# Patient Record
Sex: Male | Born: 1955 | Race: White | Hispanic: No | Marital: Married | Smoking: Former smoker
Health system: Southern US, Community
[De-identification: ages and names within clinical notes are randomized; demographics above are authoritative.]

## PROBLEM LIST (undated history)

## (undated) DIAGNOSIS — Z794 Long term (current) use of insulin: Secondary | ICD-10-CM

## (undated) DIAGNOSIS — K432 Incisional hernia without obstruction or gangrene: Secondary | ICD-10-CM

## (undated) DIAGNOSIS — H269 Unspecified cataract: Secondary | ICD-10-CM

## (undated) DIAGNOSIS — M1990 Unspecified osteoarthritis, unspecified site: Secondary | ICD-10-CM

## (undated) DIAGNOSIS — I4891 Unspecified atrial fibrillation: Secondary | ICD-10-CM

## (undated) DIAGNOSIS — E785 Hyperlipidemia, unspecified: Secondary | ICD-10-CM

## (undated) DIAGNOSIS — R351 Nocturia: Secondary | ICD-10-CM

## (undated) DIAGNOSIS — N529 Male erectile dysfunction, unspecified: Secondary | ICD-10-CM

## (undated) DIAGNOSIS — Z86004 Personal history of in-situ neoplasm of other and unspecified digestive organs: Secondary | ICD-10-CM

## (undated) DIAGNOSIS — I2510 Atherosclerotic heart disease of native coronary artery without angina pectoris: Secondary | ICD-10-CM

## (undated) DIAGNOSIS — S83207A Unspecified tear of unspecified meniscus, current injury, left knee, initial encounter: Secondary | ICD-10-CM

## (undated) DIAGNOSIS — Z9189 Other specified personal risk factors, not elsewhere classified: Secondary | ICD-10-CM

## (undated) DIAGNOSIS — I10 Essential (primary) hypertension: Secondary | ICD-10-CM

## (undated) DIAGNOSIS — N400 Enlarged prostate without lower urinary tract symptoms: Secondary | ICD-10-CM

## (undated) DIAGNOSIS — Z86010 Personal history of colonic polyps: Secondary | ICD-10-CM

## (undated) DIAGNOSIS — E113393 Type 2 diabetes mellitus with moderate nonproliferative diabetic retinopathy without macular edema, bilateral: Secondary | ICD-10-CM

## (undated) DIAGNOSIS — E119 Type 2 diabetes mellitus without complications: Secondary | ICD-10-CM

## (undated) DIAGNOSIS — E7800 Pure hypercholesterolemia, unspecified: Secondary | ICD-10-CM

## (undated) DIAGNOSIS — Z973 Presence of spectacles and contact lenses: Secondary | ICD-10-CM

## (undated) DIAGNOSIS — I700 Atherosclerosis of aorta: Secondary | ICD-10-CM

## (undated) DIAGNOSIS — N401 Enlarged prostate with lower urinary tract symptoms: Secondary | ICD-10-CM

## (undated) DIAGNOSIS — N138 Other obstructive and reflux uropathy: Secondary | ICD-10-CM

## (undated) DIAGNOSIS — E162 Hypoglycemia, unspecified: Secondary | ICD-10-CM

## (undated) HISTORY — DX: Benign prostatic hyperplasia without lower urinary tract symptoms: N40.0

## (undated) HISTORY — DX: Atherosclerotic heart disease of native coronary artery without angina pectoris: I25.10

## (undated) HISTORY — DX: Incisional hernia without obstruction or gangrene: K43.2

## (undated) HISTORY — PX: SHOULDER ARTHROSCOPY: SHX128

## (undated) HISTORY — PX: ACHILLES TENDON REPAIR: SUR1153

## (undated) HISTORY — DX: Nocturia: R35.1

## (undated) HISTORY — DX: Unspecified cataract: H26.9

## (undated) HISTORY — PX: RETINAL DETACHMENT SURGERY: SHX105

## (undated) HISTORY — DX: Hypoglycemia, unspecified: E16.2

## (undated) HISTORY — DX: Benign prostatic hyperplasia with lower urinary tract symptoms: N40.1

## (undated) HISTORY — DX: Type 2 diabetes mellitus without complications: E11.9

## (undated) HISTORY — DX: Pure hypercholesterolemia, unspecified: E78.00

## (undated) HISTORY — DX: Male erectile dysfunction, unspecified: N52.9

## (undated) HISTORY — DX: Type 2 diabetes mellitus with moderate nonproliferative diabetic retinopathy without macular edema, bilateral: E11.3393

## (undated) HISTORY — DX: Unspecified osteoarthritis, unspecified site: M19.90

## (undated) HISTORY — DX: Unspecified atrial fibrillation: I48.91

## (undated) HISTORY — DX: Long term (current) use of insulin: Z79.4

## (undated) HISTORY — PX: COLONOSCOPY: SHX174

## (undated) HISTORY — DX: Other obstructive and reflux uropathy: N13.8

## (undated) HISTORY — DX: Morbid (severe) obesity due to excess calories: E66.01

## (undated) HISTORY — DX: Atherosclerosis of aorta: I70.0

---

## 2004-08-01 ENCOUNTER — Encounter: Admission: RE | Admit: 2004-08-01 | Discharge: 2004-08-01 | Payer: Self-pay | Admitting: *Deleted

## 2005-07-13 ENCOUNTER — Ambulatory Visit (HOSPITAL_COMMUNITY): Admission: RE | Admit: 2005-07-13 | Discharge: 2005-07-13 | Payer: Self-pay | Admitting: General Surgery

## 2005-07-13 ENCOUNTER — Encounter (INDEPENDENT_AMBULATORY_CARE_PROVIDER_SITE_OTHER): Payer: Self-pay | Admitting: Specialist

## 2005-07-13 HISTORY — PX: OTHER SURGICAL HISTORY: SHX169

## 2009-06-19 ENCOUNTER — Emergency Department (HOSPITAL_COMMUNITY): Admission: EM | Admit: 2009-06-19 | Discharge: 2009-06-20 | Payer: Self-pay | Admitting: Emergency Medicine

## 2010-05-31 ENCOUNTER — Encounter: Payer: Self-pay | Admitting: Specialist

## 2010-07-28 LAB — GLUCOSE, CAPILLARY: Glucose-Capillary: 127 mg/dL — ABNORMAL HIGH (ref 70–99)

## 2010-09-24 NOTE — Op Note (Signed)
[name], [name] NO.:  [id number]   MEDICAL RECORD NO.:  [id number]          PATIENT TYPE:  AMB   LOCATION:  DAY                          FACILITY:  [facility]   PHYSICIAN:  Ollen Gross. Vernell Morgans, M.D. DATE OF BIRTH:  [birth year]   DATE OF PROCEDURE:  07/13/2005  DATE OF DISCHARGE:                                 OPERATIVE REPORT   PREOPERATIVE DIAGNOSES:  Rectal adenocarcinoma in situ.   POSTOPERATIVE DIAGNOSES:  Rectal adenocarcinoma in situ.   PROCEDURE:  Transanal rectal biopsy.   SURGEON:  Ollen Gross. Carolynne Edouard, M.D.   ASSISTANT:  Gita Kudo, M.D.   ANESTHESIA:  General endotracheal.   DESCRIPTION OF PROCEDURE:  After informed consent was obtained, the patient  was brought to the operating room and left in the supine position on the  stretcher. After adequate induction of general anesthesia, the patient was  moved into a prone position on the operating room table and all pressure  points were padded. The patient's buttocks were retracted laterally with  tape, the perirectal region was prepped with Betadine, draped in the usual  sterile manner. Initially a small bullet retractor was placed in the rectum  and no abnormalities were noted. Next a deep Fansler retractor was placed in  the rectum. Posteriorly a small area that had a blue tinge to it was  identified. The patient had had a previous colonoscopy where a polyp was  removed that had adenocarcinoma in situ in it and this area was subsequently  tattooed with [place] ink. I believe that this posterior area of blue  appearance is the same area that was seen on colonoscopy. No other areas of  blue dye or abnormality were noted. This area was able to be grasped in the  ring of a ring forceps and while gentle traction was applied to this area a  laparoscopic GIA 45 stapler with a blue load was able to be placed beneath  this specimen, clamped and fired thereby excising this area beneath the  staple line. The  specimen was then removed from the patient and oriented. A  single silk stitch was placed at the cranial end of the staple line. The  staple line was examined and found to be hemostatic. No other areas of blue  enhancement were able to be identified. The perirectal region was then  infiltrated with 0.25% Marcaine with epinephrine and lidocaine jelly and two  small pieces of Gelfoam were placed in the rectum. Sterile dressings were  then applied. The patient tolerated the procedure well. At the end of the  case, all needle, sponge and instrument counts were correct. The patient was  then awakened and taken to the recovery room in stable condition.      Ollen Gross. Vernell Morgans, M.D.  Electronically Signed     PST/MEDQ  D:  07/13/2005  T:  07/14/2005  Job:  [id number]

## 2015-08-26 DIAGNOSIS — I10 Essential (primary) hypertension: Secondary | ICD-10-CM | POA: Diagnosis not present

## 2015-08-26 DIAGNOSIS — E119 Type 2 diabetes mellitus without complications: Secondary | ICD-10-CM | POA: Diagnosis not present

## 2015-08-26 DIAGNOSIS — E6609 Other obesity due to excess calories: Secondary | ICD-10-CM | POA: Diagnosis not present

## 2015-08-26 DIAGNOSIS — N5201 Erectile dysfunction due to arterial insufficiency: Secondary | ICD-10-CM | POA: Diagnosis not present

## 2015-08-26 DIAGNOSIS — Z125 Encounter for screening for malignant neoplasm of prostate: Secondary | ICD-10-CM | POA: Diagnosis not present

## 2015-08-26 DIAGNOSIS — Z209 Contact with and (suspected) exposure to unspecified communicable disease: Secondary | ICD-10-CM | POA: Diagnosis not present

## 2015-11-17 DIAGNOSIS — L509 Urticaria, unspecified: Secondary | ICD-10-CM | POA: Diagnosis not present

## 2016-03-18 DIAGNOSIS — M25552 Pain in left hip: Secondary | ICD-10-CM | POA: Diagnosis not present

## 2016-03-18 DIAGNOSIS — M25562 Pain in left knee: Secondary | ICD-10-CM | POA: Diagnosis not present

## 2016-03-29 DIAGNOSIS — M25562 Pain in left knee: Secondary | ICD-10-CM | POA: Diagnosis not present

## 2016-04-29 DIAGNOSIS — J069 Acute upper respiratory infection, unspecified: Secondary | ICD-10-CM | POA: Diagnosis not present

## 2016-07-25 DIAGNOSIS — E7800 Pure hypercholesterolemia, unspecified: Secondary | ICD-10-CM | POA: Diagnosis not present

## 2016-07-25 DIAGNOSIS — I10 Essential (primary) hypertension: Secondary | ICD-10-CM | POA: Diagnosis not present

## 2016-07-25 DIAGNOSIS — Z86010 Personal history of colonic polyps: Secondary | ICD-10-CM | POA: Diagnosis not present

## 2016-07-25 DIAGNOSIS — E119 Type 2 diabetes mellitus without complications: Secondary | ICD-10-CM | POA: Diagnosis not present

## 2016-08-15 DIAGNOSIS — M238X2 Other internal derangements of left knee: Secondary | ICD-10-CM | POA: Diagnosis not present

## 2016-08-18 ENCOUNTER — Other Ambulatory Visit: Payer: Self-pay | Admitting: Specialist

## 2016-08-24 ENCOUNTER — Ambulatory Visit: Payer: Self-pay | Admitting: Orthopedic Surgery

## 2016-08-26 ENCOUNTER — Encounter (HOSPITAL_BASED_OUTPATIENT_CLINIC_OR_DEPARTMENT_OTHER): Payer: Self-pay | Admitting: *Deleted

## 2016-08-26 NOTE — Progress Notes (Signed)
   08/26/16 1341  OBSTRUCTIVE SLEEP APNEA  Have you ever been diagnosed with sleep apnea through a sleep study? No  Do you snore loudly (loud enough to be heard through closed doors)?  1  Do you often feel tired, fatigued, or sleepy during the daytime (such as falling asleep during driving or talking to someone)? 0  Has anyone observed you stop breathing during your sleep? 0  Do you have, or are you being treated for high blood pressure? 1  BMI more than 35 kg/m2? 1  Age > 50 (1-yes) 1  Male Gender (Yes=1) 1  Obstructive Sleep Apnea Score 5   

## 2016-08-26 NOTE — Progress Notes (Signed)
NPO AFTER MN W/ EXCEPTION CLEAR LIQUIDS UNTIL 0700 ( NO CREAM /MILK PRODUCTS).  ARRIVE AT 1100.  NEEDS ISTAT 8 AND EKG.  WILL TAKE METOPROLOL AND PRAVASTATIN AM DOS W/ SIPS OF WATER.  VERBALIZED UNDERSTANDING TO DO HALF LEVEMIR DOSE NIGHT BEFORE SURGERY .   PT STATED THAT HE HAS A CROWN MISSING FROM RIGHT UPPER TOOTH.

## 2016-09-01 ENCOUNTER — Ambulatory Visit (HOSPITAL_COMMUNITY): Payer: BLUE CROSS/BLUE SHIELD

## 2016-09-01 ENCOUNTER — Encounter (HOSPITAL_BASED_OUTPATIENT_CLINIC_OR_DEPARTMENT_OTHER)
Admission: RE | Disposition: A | Discharge status: Home / Self Care | Payer: Self-pay | Source: Ambulatory Visit | Attending: Specialist

## 2016-09-01 ENCOUNTER — Other Ambulatory Visit: Payer: Self-pay

## 2016-09-01 ENCOUNTER — Encounter (HOSPITAL_BASED_OUTPATIENT_CLINIC_OR_DEPARTMENT_OTHER): Payer: Self-pay | Admitting: Anesthesiology

## 2016-09-01 ENCOUNTER — Ambulatory Visit (HOSPITAL_BASED_OUTPATIENT_CLINIC_OR_DEPARTMENT_OTHER): Payer: BLUE CROSS/BLUE SHIELD | Admitting: Anesthesiology

## 2016-09-01 ENCOUNTER — Ambulatory Visit (HOSPITAL_BASED_OUTPATIENT_CLINIC_OR_DEPARTMENT_OTHER)
Admission: RE | Admit: 2016-09-01 | Discharge: 2016-09-01 | Disposition: A | Discharge status: Home / Self Care | Payer: BLUE CROSS/BLUE SHIELD | Source: Ambulatory Visit | Attending: Specialist | Admitting: Specialist

## 2016-09-01 DIAGNOSIS — M1712 Unilateral primary osteoarthritis, left knee: Secondary | ICD-10-CM | POA: Diagnosis not present

## 2016-09-01 DIAGNOSIS — I10 Essential (primary) hypertension: Secondary | ICD-10-CM | POA: Diagnosis not present

## 2016-09-01 DIAGNOSIS — Z791 Long term (current) use of non-steroidal anti-inflammatories (NSAID): Secondary | ICD-10-CM | POA: Diagnosis not present

## 2016-09-01 DIAGNOSIS — E785 Hyperlipidemia, unspecified: Secondary | ICD-10-CM | POA: Diagnosis not present

## 2016-09-01 DIAGNOSIS — M94262 Chondromalacia, left knee: Secondary | ICD-10-CM | POA: Insufficient documentation

## 2016-09-01 DIAGNOSIS — Z87891 Personal history of nicotine dependence: Secondary | ICD-10-CM | POA: Insufficient documentation

## 2016-09-01 DIAGNOSIS — S83232A Complex tear of medial meniscus, current injury, left knee, initial encounter: Secondary | ICD-10-CM | POA: Insufficient documentation

## 2016-09-01 DIAGNOSIS — S83242A Other tear of medial meniscus, current injury, left knee, initial encounter: Secondary | ICD-10-CM | POA: Diagnosis not present

## 2016-09-01 DIAGNOSIS — M84452A Pathological fracture, left femur, initial encounter for fracture: Secondary | ICD-10-CM | POA: Diagnosis not present

## 2016-09-01 DIAGNOSIS — Z7982 Long term (current) use of aspirin: Secondary | ICD-10-CM | POA: Insufficient documentation

## 2016-09-01 DIAGNOSIS — Z86008 Personal history of in-situ neoplasm of other site: Secondary | ICD-10-CM | POA: Diagnosis not present

## 2016-09-01 DIAGNOSIS — Z79899 Other long term (current) drug therapy: Secondary | ICD-10-CM | POA: Insufficient documentation

## 2016-09-01 DIAGNOSIS — S82142A Displaced bicondylar fracture of left tibia, initial encounter for closed fracture: Secondary | ICD-10-CM | POA: Diagnosis not present

## 2016-09-01 DIAGNOSIS — E119 Type 2 diabetes mellitus without complications: Secondary | ICD-10-CM | POA: Insufficient documentation

## 2016-09-01 DIAGNOSIS — Z9012 Acquired absence of left breast and nipple: Secondary | ICD-10-CM

## 2016-09-01 DIAGNOSIS — M84462A Pathological fracture, left tibia, initial encounter for fracture: Secondary | ICD-10-CM | POA: Diagnosis not present

## 2016-09-01 DIAGNOSIS — X58XXXA Exposure to other specified factors, initial encounter: Secondary | ICD-10-CM | POA: Insufficient documentation

## 2016-09-01 DIAGNOSIS — M23304 Other meniscus derangements, unspecified medial meniscus, left knee: Secondary | ICD-10-CM | POA: Diagnosis not present

## 2016-09-01 DIAGNOSIS — Z471 Aftercare following joint replacement surgery: Secondary | ICD-10-CM | POA: Diagnosis not present

## 2016-09-01 DIAGNOSIS — G8918 Other acute postprocedural pain: Secondary | ICD-10-CM | POA: Diagnosis not present

## 2016-09-01 DIAGNOSIS — Z419 Encounter for procedure for purposes other than remedying health state, unspecified: Secondary | ICD-10-CM

## 2016-09-01 DIAGNOSIS — Z96652 Presence of left artificial knee joint: Secondary | ICD-10-CM | POA: Diagnosis not present

## 2016-09-01 DIAGNOSIS — Z794 Long term (current) use of insulin: Secondary | ICD-10-CM | POA: Insufficient documentation

## 2016-09-01 HISTORY — DX: Other specified personal risk factors, not elsewhere classified: Z91.89

## 2016-09-01 HISTORY — DX: Type 2 diabetes mellitus without complications: E11.9

## 2016-09-01 HISTORY — DX: Personal history of colonic polyps: Z86.010

## 2016-09-01 HISTORY — DX: Unspecified osteoarthritis, unspecified site: M19.90

## 2016-09-01 HISTORY — DX: Hyperlipidemia, unspecified: E78.5

## 2016-09-01 HISTORY — PX: KNEE ARTHROSCOPY WITH MEDIAL MENISECTOMY: SHX5651

## 2016-09-01 HISTORY — DX: Unspecified tear of unspecified meniscus, current injury, left knee, initial encounter: S83.207A

## 2016-09-01 HISTORY — DX: Essential (primary) hypertension: I10

## 2016-09-01 HISTORY — DX: Personal history of in-situ neoplasm of other and unspecified digestive organs: Z86.004

## 2016-09-01 HISTORY — DX: Presence of spectacles and contact lenses: Z97.3

## 2016-09-01 LAB — POCT I-STAT, CHEM 8
BUN: 12 mg/dL (ref 6–20)
CHLORIDE: 104 mmol/L (ref 101–111)
Calcium, Ion: 1.21 mmol/L (ref 1.15–1.40)
Creatinine, Ser: 0.6 mg/dL — ABNORMAL LOW (ref 0.61–1.24)
Glucose, Bld: 121 mg/dL — ABNORMAL HIGH (ref 65–99)
HCT: 43 % (ref 39.0–52.0)
Hemoglobin: 14.6 g/dL (ref 13.0–17.0)
POTASSIUM: 3.6 mmol/L (ref 3.5–5.1)
SODIUM: 141 mmol/L (ref 135–145)
TCO2: 28 mmol/L (ref 0–100)

## 2016-09-01 LAB — GLUCOSE, CAPILLARY: Glucose-Capillary: 108 mg/dL — ABNORMAL HIGH (ref 65–99)

## 2016-09-01 SURGERY — ARTHROSCOPY, KNEE, WITH MEDIAL MENISCECTOMY
Anesthesia: General | Site: Knee | Laterality: Left

## 2016-09-01 MED ORDER — FENTANYL CITRATE (PF) 100 MCG/2ML IJ SOLN
INTRAMUSCULAR | Status: DC | PRN
  Administered 2016-09-01 (×4): 50 ug via INTRAVENOUS

## 2016-09-01 MED ORDER — PROPOFOL 10 MG/ML IV BOLUS
INTRAVENOUS | Status: AC
  Filled 2016-09-01: qty 40

## 2016-09-01 MED ORDER — FENTANYL CITRATE (PF) 100 MCG/2ML IJ SOLN
INTRAMUSCULAR | Status: AC
  Filled 2016-09-01: qty 2

## 2016-09-01 MED ORDER — MORPHINE SULFATE (PF) 4 MG/ML IV SOLN
INTRAVENOUS | Status: AC
  Filled 2016-09-01: qty 1

## 2016-09-01 MED ORDER — CEFAZOLIN SODIUM-DEXTROSE 2-4 GM/100ML-% IV SOLN
INTRAVENOUS | Status: AC
  Filled 2016-09-01: qty 100

## 2016-09-01 MED ORDER — BUPIVACAINE HCL (PF) 0.25 % IJ SOLN
INTRAMUSCULAR | Status: AC
  Filled 2016-09-01: qty 30

## 2016-09-01 MED ORDER — DEXAMETHASONE SODIUM PHOSPHATE 4 MG/ML IJ SOLN
INTRAMUSCULAR | Status: DC | PRN
  Administered 2016-09-01: 10 mg via INTRAVENOUS

## 2016-09-01 MED ORDER — CEPHALEXIN 500 MG PO CAPS: 500.0000 mg | ORAL_CAPSULE | Freq: Three times a day (TID) | ORAL | 0 refills | Status: AC

## 2016-09-01 MED ORDER — TRIAMCINOLONE ACETONIDE 40 MG/ML IJ SUSP
INTRAMUSCULAR | Status: AC
  Filled 2016-09-01: qty 2

## 2016-09-01 MED ORDER — ONDANSETRON HCL 4 MG/2ML IJ SOLN
INTRAMUSCULAR | Status: DC | PRN
  Administered 2016-09-01: 4 mg via INTRAVENOUS

## 2016-09-01 MED ORDER — DEXTROSE 5 % IV SOLN
3.0000 g | INTRAVENOUS | Status: AC
  Administered 2016-09-01: 3 g via INTRAVENOUS
  Filled 2016-09-01: qty 3

## 2016-09-01 MED ORDER — SODIUM CHLORIDE 0.9 % IR SOLN
Status: DC | PRN
  Administered 2016-09-01 (×2): 3000 mL

## 2016-09-01 MED ORDER — CHLORHEXIDINE GLUCONATE 4 % EX LIQD
60.0000 mL | Freq: Once | CUTANEOUS | Status: DC
  Filled 2016-09-01: qty 118

## 2016-09-01 MED ORDER — LACTATED RINGERS IV SOLN
INTRAVENOUS | Status: DC
  Administered 2016-09-01 (×2): via INTRAVENOUS
  Filled 2016-09-01: qty 1000

## 2016-09-01 MED ORDER — MIDAZOLAM HCL 2 MG/2ML IJ SOLN
INTRAMUSCULAR | Status: AC
  Filled 2016-09-01: qty 2

## 2016-09-01 MED ORDER — LABETALOL HCL 5 MG/ML IV SOLN
INTRAVENOUS | Status: DC | PRN
  Administered 2016-09-01 (×3): 5 mg via INTRAVENOUS

## 2016-09-01 MED ORDER — METOCLOPRAMIDE HCL 5 MG/ML IJ SOLN
INTRAMUSCULAR | Status: AC
  Filled 2016-09-01: qty 2

## 2016-09-01 MED ORDER — PROPOFOL 10 MG/ML IV BOLUS
INTRAVENOUS | Status: DC | PRN
  Administered 2016-09-01: 300 mg via INTRAVENOUS

## 2016-09-01 MED ORDER — HYDROMORPHONE HCL 4 MG/ML IJ SOLN
INTRAMUSCULAR | Status: AC
  Filled 2016-09-01: qty 1

## 2016-09-01 MED ORDER — HYDROCODONE-ACETAMINOPHEN 7.5-325 MG PO TABS: 1.0000 | ORAL_TABLET | ORAL | 0 refills | Status: AC | PRN

## 2016-09-01 MED ORDER — CEFAZOLIN SODIUM-DEXTROSE 2-4 GM/100ML-% IV SOLN
2.0000 g | INTRAVENOUS | Status: DC
  Filled 2016-09-01: qty 100

## 2016-09-01 MED ORDER — ACETAMINOPHEN 10 MG/ML IV SOLN
INTRAVENOUS | Status: AC
Start: 2016-09-01 — End: 2016-09-01
  Filled 2016-09-01: qty 100

## 2016-09-01 MED ORDER — LIDOCAINE 2% (20 MG/ML) 5 ML SYRINGE
INTRAMUSCULAR | Status: DC | PRN
  Administered 2016-09-01: 100 mg via INTRAVENOUS

## 2016-09-01 MED ORDER — MORPHINE SULFATE (PF) 4 MG/ML IV SOLN
INTRAVENOUS | Status: DC | PRN
  Administered 2016-09-01: 1 mg

## 2016-09-01 MED ORDER — ASPIRIN EC 325 MG PO TBEC: 325.0000 mg | DELAYED_RELEASE_TABLET | Freq: Two times a day (BID) | ORAL | 0 refills | Status: DC

## 2016-09-01 MED ORDER — METOCLOPRAMIDE HCL 5 MG/ML IJ SOLN
INTRAMUSCULAR | Status: DC | PRN
  Administered 2016-09-01: 10 mg via INTRAVENOUS

## 2016-09-01 MED ORDER — LIDOCAINE 2% (20 MG/ML) 5 ML SYRINGE
INTRAMUSCULAR | Status: AC
  Filled 2016-09-01: qty 5

## 2016-09-01 MED ORDER — ONDANSETRON HCL 4 MG/2ML IJ SOLN
INTRAMUSCULAR | Status: AC
  Filled 2016-09-01: qty 2

## 2016-09-01 MED ORDER — HYDROMORPHONE HCL 1 MG/ML IJ SOLN
INTRAMUSCULAR | Status: DC | PRN
  Administered 2016-09-01 (×3): 0.5 mg via INTRAVENOUS

## 2016-09-01 MED ORDER — DEXAMETHASONE SODIUM PHOSPHATE 10 MG/ML IJ SOLN
INTRAMUSCULAR | Status: AC
  Filled 2016-09-01: qty 1

## 2016-09-01 MED ORDER — FENTANYL CITRATE (PF) 100 MCG/2ML IJ SOLN
25.0000 ug | INTRAMUSCULAR | Status: DC | PRN
  Administered 2016-09-01 (×2): 25 ug via INTRAVENOUS
  Filled 2016-09-01: qty 1

## 2016-09-01 MED ORDER — LABETALOL HCL 5 MG/ML IV SOLN
INTRAVENOUS | Status: AC
  Filled 2016-09-01: qty 4

## 2016-09-01 MED ORDER — MIDAZOLAM HCL 5 MG/5ML IJ SOLN
INTRAMUSCULAR | Status: DC | PRN
  Administered 2016-09-01: 2 mg via INTRAVENOUS

## 2016-09-01 MED ORDER — ACETAMINOPHEN 10 MG/ML IV SOLN
INTRAVENOUS | Status: DC | PRN
  Administered 2016-09-01: 1000 mg via INTRAVENOUS

## 2016-09-01 MED ORDER — BUPIVACAINE HCL 0.25 % IJ SOLN
INTRAMUSCULAR | Status: DC | PRN
  Administered 2016-09-01: 20 mL
  Administered 2016-09-01: 10 mL

## 2016-09-01 MED FILL — CEPHALEXIN 500 MG CAPSULE: 500 | 4 days supply | Qty: 12 | Fill #0

## 2016-09-01 MED FILL — HYDROCODON-APAP 7.5-325: 7.5-325 | 5 days supply | Qty: 60 | Fill #0

## 2016-09-01 SURGICAL SUPPLY — 54 items
BANDAGE ESMARK 6X9 LF (GAUZE/BANDAGES/DRESSINGS) ×1 IMPLANT
BLADE CUDA GRT WHITE 3.5 (BLADE) ×3 IMPLANT
BNDG CMPR 9X6 STRL LF SNTH (GAUZE/BANDAGES/DRESSINGS) ×1
BNDG ESMARK 6X9 LF (GAUZE/BANDAGES/DRESSINGS) ×3
BNDG GAUZE ELAST 4 BULKY (GAUZE/BANDAGES/DRESSINGS) ×3 IMPLANT
DRAPE ARTHROSCOPY W/POUCH 114 (DRAPES) ×3 IMPLANT
DRAPE C-ARM 42X72 X-RAY (DRAPES) ×3 IMPLANT
DRAPE INCISE IOBAN 66X45 STRL (DRAPES) ×3 IMPLANT
DRSG PAD ABDOMINAL 8X10 ST (GAUZE/BANDAGES/DRESSINGS) ×2 IMPLANT
DURAPREP 26ML APPLICATOR (WOUND CARE) ×3 IMPLANT
ELECT MENISCUS 165MM 90D (ELECTRODE) IMPLANT
ELECT REM PT RETURN 9FT ADLT (ELECTROSURGICAL) ×3
ELECTRODE REM PT RTRN 9FT ADLT (ELECTROSURGICAL) IMPLANT
GAUZE SPONGE 4X4 12PLY STRL (GAUZE/BANDAGES/DRESSINGS) ×1 IMPLANT
GAUZE XEROFORM 1X8 LF (GAUZE/BANDAGES/DRESSINGS) ×3 IMPLANT
GLOVE BIO SURGEON STRL SZ7.5 (GLOVE) ×3 IMPLANT
GLOVE BIO SURGEON STRL SZ8 (GLOVE) ×3 IMPLANT
GLOVE ECLIPSE 7.0 STRL STRAW (GLOVE) ×2 IMPLANT
GLOVE INDICATOR 7.0 STRL GRN (GLOVE) ×4 IMPLANT
GLOVE INDICATOR 8.0 STRL GRN (GLOVE) ×6 IMPLANT
GOWN STRL REUS W/ TWL LRG LVL3 (GOWN DISPOSABLE) ×1 IMPLANT
GOWN STRL REUS W/ TWL XL LVL3 (GOWN DISPOSABLE) ×1 IMPLANT
GOWN STRL REUS W/TWL LRG LVL3 (GOWN DISPOSABLE)
GOWN STRL REUS W/TWL XL LVL3 (GOWN DISPOSABLE) ×9
GRAFT FILLER BONE 5ML (Knees) IMPLANT
IMMOBILIZER KNEE 22 UNIV (SOFTGOODS) IMPLANT
IMMOBILIZER KNEE 24 THIGH 36 (MISCELLANEOUS) IMPLANT
IMMOBILIZER KNEE 24 UNIV (MISCELLANEOUS)
IV NS IRRIG 3000ML ARTHROMATIC (IV SOLUTION) ×6 IMPLANT
KIT ACCUFILL 5CC (Knees) ×1 IMPLANT
KIT KNEE SCP 414.502 (Knees) ×3 IMPLANT
KIT MIXER ACCUMIX (KITS) ×2 IMPLANT
KIT RM TURNOVER CYSTO AR (KITS) ×3 IMPLANT
KNEE KIT SCP W/SIDE ACCUPORT (Joint) ×2 IMPLANT
KNEE WRAP E Z 3 GEL PACK (MISCELLANEOUS) ×3 IMPLANT
MANIFOLD NEPTUNE II (INSTRUMENTS) ×3 IMPLANT
NEEDLE HYPO 22GX1.5 SAFETY (NEEDLE) ×3 IMPLANT
PACK ARTHROSCOPY DSU (CUSTOM PROCEDURE TRAY) ×3 IMPLANT
PACK BASIN DAY SURGERY FS (CUSTOM PROCEDURE TRAY) ×3 IMPLANT
PAD ABD 8X10 STRL (GAUZE/BANDAGES/DRESSINGS) ×3 IMPLANT
PAD ARMBOARD 7.5X6 YLW CONV (MISCELLANEOUS) ×3 IMPLANT
PENCIL BUTTON HOLSTER BLD 10FT (ELECTRODE) IMPLANT
PROBE BIPOLAR 50 DEGREE SUCT (MISCELLANEOUS) ×1 IMPLANT
PROBE BIPOLAR ATHRO 135MM 90D (MISCELLANEOUS) IMPLANT
SET ARTHROSCOPY TUBING (MISCELLANEOUS) ×3
SET ARTHROSCOPY TUBING LN (MISCELLANEOUS) ×1 IMPLANT
SPONGE GAUZE 4X4 12PLY (GAUZE/BANDAGES/DRESSINGS) ×3 IMPLANT
SUT ETHILON 4 0 PS 2 18 (SUTURE) ×3 IMPLANT
SYR CONTROL 10ML LL (SYRINGE) ×3 IMPLANT
TOWEL OR 17X24 6PK STRL BLUE (TOWEL DISPOSABLE) ×5 IMPLANT
TUBE CONNECTING 12'X1/4 (SUCTIONS) ×1
TUBE CONNECTING 12X1/4 (SUCTIONS) ×2 IMPLANT
WAND 30 DEG SABER W/CORD (SURGICAL WAND) IMPLANT
WATER STERILE IRR 500ML POUR (IV SOLUTION) ×3 IMPLANT

## 2016-09-01 NOTE — Op Note (Signed)
[phone]

## 2016-09-01 NOTE — Anesthesia Preprocedure Evaluation (Addendum)
Anesthesia Evaluation  Patient identified by MRN, date of birth, ID band Patient awake    Reviewed: Allergy & Precautions, NPO status , Unable to perform ROS - Chart review only  Airway Mallampati: II  TM Distance: >3 FB     Dental   Pulmonary former smoker,    breath sounds clear to auscultation       Cardiovascular hypertension,  Rhythm:Regular Rate:Normal     Neuro/Psych    GI/Hepatic negative GI ROS, Neg liver ROS,   Endo/Other  diabetes  Renal/GU      Musculoskeletal  (+) Arthritis ,   Abdominal   Peds  Hematology   Anesthesia Other Findings   Reproductive/Obstetrics                             Anesthesia Physical Anesthesia Plan  ASA: III  Anesthesia Plan: General   Post-op Pain Management:    Induction: Intravenous  Airway Management Planned: LMA  Additional Equipment:   Intra-op Plan:   Post-operative Plan: Extubation in OR  Informed Consent: I have reviewed the patients History and Physical, chart, labs and discussed the procedure including the risks, benefits and alternatives for the proposed anesthesia with the patient or authorized representative who has indicated his/her understanding and acceptance.   Dental advisory given  Plan Discussed with: Anesthesiologist and CRNA  Anesthesia Plan Comments:         Anesthesia Quick Evaluation

## 2016-09-01 NOTE — Discharge Instructions (Signed)

## 2016-09-01 NOTE — Anesthesia Procedure Notes (Signed)
Anesthesia Regional Block: Adductor canal block   Pre-Anesthetic Checklist: ,, timeout performed, Correct Patient, Correct Site, Correct Laterality, Correct Procedure, Correct Position, site marked, Risks and benefits discussed, Surgical consent,  Pre-op evaluation,  At surgeon's request  Laterality: Left  Prep: chloraprep       Needles:   Needle Type: Stimulator Needle - 80          Additional Needles:   Procedures: Doppler guided, Ultrasound guided,,,,,,  Narrative:  Start time: 09/01/2016 12:25 PM End time: 09/01/2016 12:40 PM Injection made incrementally with aspirations every 5 mL.  Performed by: Personally  Anesthesiologist: Dorris Singh

## 2016-09-01 NOTE — Transfer of Care (Signed)
  Last Vitals:  Vitals:   09/01/16 1103 09/01/16 1609  BP: (!) 145/78   Pulse: 81 (P) 82  Resp: 20 (P) 12  Temp: 36.3 C (P) 36.8 C    Last Pain:  Vitals:   09/01/16 1103  TempSrc: Oral      Patients Stated Pain Goal: 5 (09/01/16 1128) Immediate Anesthesia Transfer of Care Note  Patient: Andrew [name]  Procedure(s) Performed: Procedure(s) (LRB): Left knee arthroscopy, partial medial meniscectomy, chondroplasty, arthroscopic assisted internal fixation of medial femoral condyle and medial tibial plateau (Left)  Patient Location: PACU  Anesthesia Type: General  Level of Consciousness: awake, alert  and oriented  Airway & Oxygen Therapy: Patient Spontanous Breathing and Patient connected to nasal cannula oxygen  Post-op Assessment: Report given to PACU RN and Post -op Vital signs reviewed and stable  Post vital signs: Reviewed and stable  Complications: No apparent anesthesia complications

## 2016-09-01 NOTE — Interval H&P Note (Signed)
History and Physical Interval Note:  09/01/2016 2:23 PM  Andrew [name]  has presented today for surgery, with the diagnosis of Left knee torn medial meniscus, medial femoral condyle and medial tibial plateau insufficiency fractures  The various methods of treatment have been discussed with the patient and family. After consideration of risks, benefits and other options for treatment, the patient has consented to  Procedure(s): Left knee arthroscopy, partial medial meniscectomy, chondroplasty, arthroscopic assisted internal fixation of medial femoral condyle and medial tibial plateau (Left) as a surgical intervention .  The patient's history has been reviewed, patient examined, no change in status, stable for surgery.  I have reviewed the patient's chart and labs.  Questions were answered to the patient's satisfaction.     [name]

## 2016-09-01 NOTE — Anesthesia Preprocedure Evaluation (Addendum)
Anesthesia Evaluation  Patient identified by MRN, date of birth, ID band Patient awake    Reviewed: Allergy & Precautions, NPO status , Patient's Chart, lab work & pertinent test results  Airway Mallampati: II  TM Distance: >3 FB     Dental   Pulmonary former smoker,    breath sounds clear to auscultation       Cardiovascular hypertension,  Rhythm:Regular Rate:Normal     Neuro/Psych    GI/Hepatic negative GI ROS, Neg liver ROS,   Endo/Other  diabetes  Renal/GU      Musculoskeletal  (+) Arthritis ,   Abdominal   Peds  Hematology   Anesthesia Other Findings   Reproductive/Obstetrics                             Anesthesia Physical Anesthesia Plan  ASA: III  Anesthesia Plan: General   Post-op Pain Management:  Regional for Post-op pain   Induction: Intravenous  Airway Management Planned: LMA  Additional Equipment:   Intra-op Plan:   Post-operative Plan: Extubation in OR  Informed Consent: I have reviewed the patients History and Physical, chart, labs and discussed the procedure including the risks, benefits and alternatives for the proposed anesthesia with the patient or authorized representative who has indicated his/her understanding and acceptance.   Dental advisory given  Plan Discussed with: CRNA and Anesthesiologist  Anesthesia Plan Comments:        Anesthesia Quick Evaluation

## 2016-09-01 NOTE — H&P (Signed)
Andrew [name] is an 61 y.o. male.   Chief Complaint: Left knee pain HPI: Patient presents with joint discomfort that had been persistent for several weeks now. Despite conservative treatments, his discomfort has not improved. Imaging was obtained. Other conservative and surgical treatments were discussed in detail. Patient wishes to proceed with surgery as consented. Denies SOB, CP, or calf pain. No Fever, chills, or nausea/ vomiting.    Past Medical History:  Diagnosis Date  . Acute meniscal tear of left knee   . At risk for sleep apnea    STOP-BANG=  5      SENT TO PCP 08-26-2016  . History of carcinoma in situ of rectum    07-13-2005  . History of colon polyps    03-16-2009  hyperplastic   . Hyperlipidemia   . Hypertension   . OA (osteoarthritis)    left knee, fingers  . Type 2 diabetes mellitus (HCC)   . Wears glasses     Past Surgical History:  Procedure Laterality Date  . ACHILLES TENDON REPAIR Left 2013  approx.  . COLONOSCOPY  last one 2015  . RETINAL DETACHMENT SURGERY Right 2012  approx.  Marland Kitchen SHOULDER ARTHROSCOPY Left 2010 approx.  . TRANSANAL RECTAL EXCISIONAL BX  07/13/2005   adenocarcinoma in situ    History reviewed. No pertinent family history. Social History:  reports that he quit smoking about 25 years ago. His smoking use included Cigarettes. He quit after 21.00 years of use. He has never used smokeless tobacco. He reports that he does not drink alcohol or use drugs.  Allergies: No Known Allergies  Medications Prior to Admission  Medication Sig Dispense Refill  . acetaminophen (TYLENOL) 500 MG tablet Take 500 mg by mouth every 6 (six) hours as needed.    Marland Kitchen aspirin EC 81 MG tablet Take 81 mg by mouth daily.    Marland Kitchen ibuprofen (ADVIL,MOTRIN) 200 MG tablet Take 200 mg by mouth 2 (two) times daily. And PRN    . insulin detemir (LEVEMIR) 100 UNIT/ML injection Inject 32 Units into the skin at bedtime.    Marland Kitchen lisinopril (PRINIVIL,ZESTRIL) 10 MG tablet Take 10 mg by  mouth every morning.    . meloxicam (MOBIC) 15 MG tablet Take 15 mg by mouth daily.    . metFORMIN (GLUCOPHAGE) 1000 MG tablet Take 1,000 mg by mouth 2 (two) times daily with a meal. Takes one tablet in am /  1 1/2 tablets in pm    . metoprolol succinate (TOPROL-XL) 100 MG 24 hr tablet Take 100 mg by mouth every morning. Take with or immediately following a meal.    . Naproxen Sodium (ALEVE) 220 MG CAPS Take by mouth as needed.    . pioglitazone (ACTOS) 45 MG tablet Take 45 mg by mouth every morning.    . pravastatin (PRAVACHOL) 80 MG tablet Take 80 mg by mouth every morning.      Results for orders placed or performed during the hospital encounter of 09/01/16 (from the past 48 hour(s))  I-STAT, chem 8     Status: Abnormal   Collection Time: 09/01/16 11:50 AM  Result Value Ref Range   Sodium 141 135 - 145 mmol/L   Potassium 3.6 3.5 - 5.1 mmol/L   Chloride 104 101 - 111 mmol/L   BUN 12 6 - 20 mg/dL   Creatinine, Ser 1.61 (L) 0.61 - 1.24 mg/dL   Glucose, Bld 096 (H) 65 - 99 mg/dL   Calcium, Ion 0.45 4.09 - 1.40 mmol/L  TCO2 28 0 - 100 mmol/L   Hemoglobin 14.6 13.0 - 17.0 g/dL   HCT 40.9 81.1 - 91.4 %   No results found.  Review of Systems  Constitutional: Negative.   HENT: Negative.   Eyes: Negative.   Respiratory: Negative.   Cardiovascular: Negative.   Gastrointestinal: Negative.   Genitourinary: Negative.   Musculoskeletal: Positive for joint pain.  Skin: Negative.   Neurological: Negative.   Endo/Heme/Allergies: Negative.   Psychiatric/Behavioral: Negative.     Blood pressure (!) 145/78, pulse 81, temperature 97.4 F (36.3 C), temperature source Oral, resp. rate 20, height  (1.778 m), weight 128.8 kg (284 lb), SpO2 98 %. Physical Exam  Constitutional: He is oriented to person, place, and time. He appears well-developed.  HENT:  Head: Normocephalic.  Eyes: EOM are normal.  Neck: Normal range of motion.  Cardiovascular: Normal rate and intact distal pulses.    Respiratory: Effort normal.  GI: Soft.  Genitourinary:  Genitourinary Comments: Deferred  Musculoskeletal:  Left knee pain. Good ROM. LLE grossly n/v intact.  Neurological: He is alert and oriented to person, place, and time.  Skin: Skin is warm and dry.  Psychiatric: His behavior is normal.     Assessment/Plan Left knee meniscus tear, OA , and insufficiency fracture: Knee arthroscopy as consented D/c home Follow instructions f/u in office  STILWELL, BRYSON L, PA-C 09/01/2016, 2:07 PM

## 2016-09-01 NOTE — H&P (View-Only) (Signed)
   08/26/16 1341  OBSTRUCTIVE SLEEP APNEA  Have you ever been diagnosed with sleep apnea through a sleep study? No  Do you snore loudly (loud enough to be heard through closed doors)?  1  Do you often feel tired, fatigued, or sleepy during the daytime (such as falling asleep during driving or talking to someone)? 0  Has anyone observed you stop breathing during your sleep? 0  Do you have, or are you being treated for high blood pressure? 1  BMI more than 35 kg/m2? 1  Age > 50 (1-yes) 1  Male Gender (Yes=1) 1  Obstructive Sleep Apnea Score 5

## 2016-09-01 NOTE — Anesthesia Procedure Notes (Signed)
Procedure Name: LMA Insertion Date/Time: 09/01/2016 2:36 PM Performed by: Mal Amabile Pre-anesthesia Checklist: Patient identified, Emergency Drugs available, Suction available and Patient being monitored Patient Re-evaluated:Patient Re-evaluated prior to inductionOxygen Delivery Method: Circle system utilized Preoxygenation: Pre-oxygenation with 100% oxygen Intubation Type: IV induction Ventilation: Mask ventilation without difficulty LMA: LMA inserted LMA Size: 5.0 Number of attempts: 1 Airway Equipment and Method: Bite block Placement Confirmation: positive ETCO2 Tube secured with: Tape Dental Injury: Teeth and Oropharynx as per pre-operative assessment

## 2016-09-01 NOTE — Anesthesia Postprocedure Evaluation (Signed)
Anesthesia Post Note  Patient: Andrew [name]  Procedure(s) Performed: Procedure(s) (LRB): Left knee arthroscopy, partial medial meniscectomy, chondroplasty, arthroscopic assisted internal fixation of medial femoral condyle and medial tibial plateau (Left)  Patient location during evaluation: PACU Anesthesia Type: General Level of consciousness: awake and alert and oriented Pain management: pain level controlled Vital Signs Assessment: post-procedure vital signs reviewed and stable Respiratory status: spontaneous breathing, nonlabored ventilation and respiratory function stable Cardiovascular status: blood pressure returned to baseline and stable Postop Assessment: no signs of nausea or vomiting Anesthetic complications: no       Last Vitals:  Vitals:   09/01/16 1645 09/01/16 1700  BP: 131/83 128/81  Pulse: 78 76  Resp: 12 12  Temp:      Last Pain:  Vitals:   09/01/16 1645  TempSrc:   PainSc: 4                  Andrew Losee A.

## 2016-09-02 ENCOUNTER — Encounter (HOSPITAL_BASED_OUTPATIENT_CLINIC_OR_DEPARTMENT_OTHER): Payer: Self-pay | Admitting: Specialist

## 2016-09-05 NOTE — Op Note (Signed)
NAME:  NYXON, STRUPP NO.:  MEDICAL RECORD NO.:  [id number]  LOCATION:                                 FACILITY:  PHYSICIAN:  Cynda Familia, M.D. DATE OF BIRTH:  DATE OF PROCEDURE:  09/01/2016 DATE OF DISCHARGE:                              OPERATIVE REPORT   PREOPERATIVE DIAGNOSES: 1. Left knee medial femoral condyle insufficiency fracture. 2. Medial to plateau insufficiency fracture. 3. Torn medial meniscus, complex. 4. Osteoarthritis.  POSTOPERATIVE DIAGNOSES: 1. Left knee medial femoral condyle insufficiency fracture. 2. Medial to plateau insufficiency fracture. 3. Torn medial meniscus, complex. 4. 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  PROCEDURE: 1. Left knee arthroscopic-assisted internal fixation of insufficiency     fracture medial femoral condyle. 2. Left knee arthroscopic-assisted internal fixation medial to plateau     insufficiency fracture. 3. Partial medial meniscectomy. 4. Chondroplasty, medial compartment.  SURGEON:  Cynda Familia, M.D.  ASSISTANT:  Wyatt Portela, PA-C.  ANESTHESIA:  Adductor canal block with general.  ESTIMATED BLOOD LOSS:  Minimal.  DRAINS:  None.  COMPLICATIONS:  None.  TOURNIQUET TIME:  49 minutes at 300 mmHg.  DISPOSITION:  PACU, stable.  OPERATIVE DETAILS:  The patient and family were counseled in the holding area, correct site was identified, IV was started, block was administered, antibiotics were given.  TED hose applied on uninvolved leg.  Taken to the OR, placed in supine position under general anesthesia.  All extremities were well padded and bumped.  Left lower extremity was elevated, prepped with DuraPrep and draped in sterile fashion.  A time-out done, confirmed the left side.  Exsanguinated with Esmarch, tourniquet inflated to 300 mmHg.  Arthroscopic portal was established; posteromedial, inferomedial, inferolateral.   Diagnostic arthroscopy revealed grade 2 chondromalacia femoral trochlea, light chondroplasty performed.  Mild chondromalacia of the patella __________ gutters were unremarkable.  Intercondylar notch was normal.  Anterior and posterior cruciate ligament lateral side was inspected __________ lateral meniscus.  Medial __________ inspected.  There was grade 3-4 chondromalacia of medial femoral condyle and grade 3-4 medial tibial plateau, primarily grade 3.  __________ back to stable base on the periphery.  He also had complex tear posteromedial meniscus.  Utilizing baskets and motorized shaver cautery system, a partial medial menisectomy was performed back to stable base, properly beveled and contoured. Arthroscopic kit was removed.  The knee was brought in the C-arm and percutaneously located on the medial femoral condyle stress fracture in both AP and lateral planes and then percutaneously placed a trocar into the medial tibial plateau insufficiency fracture confirmed AP and lateral planes correlating with the MRI scan, that were reviewed at the same time.  At this point in time, utilized the [employer].  The Accufill was placed into the femoral lesion, made sure not to __________, but sequentially turned the trocar to get the appropriate amount of Accufill in 360 degrees.  This was then done in the same way in the posteromedial tibia.  Made it sure I did not overfill in the area.  __________ we then re-arthroscoped the knee. There was no evidence of  extravasation of Accufill into the knee.  The knee was thoroughly irrigated.  The scope was removed.  After 18 minutes, the trocar was removed.  Final C-arm spots revealed that the lesions were adequately filled with Accufill and there was no extravasation.  The portals were closed following __________ at the end of the case placed 10 mL of Sensorcaine into the skin and periosteal areas and then 20 mL into the joint  with 4 mg of morphine and 40 of Kenalog.  A sterile compressive dressing applied.  Tourniquet deflated.  Normal circulation in foot and ankle at the end of case.  He was awakened, taken from the operating room to the PACU in stable condition.  Will be stabilized in PACU, discharged to home.  To help with the patient positioning, prepping, draping, technical surgical assistance throughout entire case, Mr. Wyatt Portela, PA-C's assistance was needed.  I will see him back next week.  No x-rays necessary.  We will start short course of physical therapy __________ prognosis is good.  I would also consider getting him a lateral heel wedge.  Consider viscosupplementation after 90 days and lastly, if this does not work and nothing else help, I think he will be a good candidate for unicompartment arthroplasty.  Hopefully, he would not need that.          ______________________________ Cynda Familia, M.D.     RAC/MEDQ  D:  09/01/2016  T:  09/02/2016  Job:  [id number]

## 2016-09-07 DIAGNOSIS — M25662 Stiffness of left knee, not elsewhere classified: Secondary | ICD-10-CM | POA: Diagnosis not present

## 2016-09-09 DIAGNOSIS — M25662 Stiffness of left knee, not elsewhere classified: Secondary | ICD-10-CM | POA: Diagnosis not present

## 2016-09-15 DIAGNOSIS — M84452D Pathological fracture, left femur, subsequent encounter for fracture with routine healing: Secondary | ICD-10-CM | POA: Diagnosis not present

## 2016-09-16 DIAGNOSIS — M25662 Stiffness of left knee, not elsewhere classified: Secondary | ICD-10-CM | POA: Diagnosis not present

## 2016-09-20 DIAGNOSIS — M25662 Stiffness of left knee, not elsewhere classified: Secondary | ICD-10-CM | POA: Diagnosis not present

## 2016-09-23 DIAGNOSIS — M25662 Stiffness of left knee, not elsewhere classified: Secondary | ICD-10-CM | POA: Diagnosis not present

## 2016-09-27 DIAGNOSIS — M25662 Stiffness of left knee, not elsewhere classified: Secondary | ICD-10-CM | POA: Diagnosis not present

## 2016-12-30 DIAGNOSIS — I10 Essential (primary) hypertension: Secondary | ICD-10-CM | POA: Diagnosis not present

## 2016-12-30 DIAGNOSIS — Z6841 Body Mass Index (BMI) 40.0 and over, adult: Secondary | ICD-10-CM | POA: Diagnosis not present

## 2016-12-30 DIAGNOSIS — E119 Type 2 diabetes mellitus without complications: Secondary | ICD-10-CM | POA: Diagnosis not present

## 2016-12-30 DIAGNOSIS — E7800 Pure hypercholesterolemia, unspecified: Secondary | ICD-10-CM | POA: Diagnosis not present

## 2017-03-24 DIAGNOSIS — M84452D Pathological fracture, left femur, subsequent encounter for fracture with routine healing: Secondary | ICD-10-CM | POA: Diagnosis not present

## 2017-03-24 DIAGNOSIS — M1712 Unilateral primary osteoarthritis, left knee: Secondary | ICD-10-CM | POA: Diagnosis not present

## 2017-03-24 DIAGNOSIS — Z4789 Encounter for other orthopedic aftercare: Secondary | ICD-10-CM | POA: Diagnosis not present

## 2017-06-22 DIAGNOSIS — E119 Type 2 diabetes mellitus without complications: Secondary | ICD-10-CM | POA: Diagnosis not present

## 2017-06-22 DIAGNOSIS — E1165 Type 2 diabetes mellitus with hyperglycemia: Secondary | ICD-10-CM | POA: Diagnosis not present

## 2017-07-05 DIAGNOSIS — E7800 Pure hypercholesterolemia, unspecified: Secondary | ICD-10-CM | POA: Diagnosis not present

## 2017-07-05 DIAGNOSIS — I10 Essential (primary) hypertension: Secondary | ICD-10-CM | POA: Diagnosis not present

## 2017-07-05 DIAGNOSIS — E119 Type 2 diabetes mellitus without complications: Secondary | ICD-10-CM | POA: Diagnosis not present

## 2017-09-13 DIAGNOSIS — S90862A Insect bite (nonvenomous), left foot, initial encounter: Secondary | ICD-10-CM | POA: Diagnosis not present

## 2018-01-01 DIAGNOSIS — I10 Essential (primary) hypertension: Secondary | ICD-10-CM | POA: Diagnosis not present

## 2018-01-01 DIAGNOSIS — E119 Type 2 diabetes mellitus without complications: Secondary | ICD-10-CM | POA: Diagnosis not present

## 2018-01-01 DIAGNOSIS — Z125 Encounter for screening for malignant neoplasm of prostate: Secondary | ICD-10-CM | POA: Diagnosis not present

## 2018-01-01 DIAGNOSIS — E7800 Pure hypercholesterolemia, unspecified: Secondary | ICD-10-CM | POA: Diagnosis not present

## 2018-01-01 DIAGNOSIS — Z6841 Body Mass Index (BMI) 40.0 and over, adult: Secondary | ICD-10-CM | POA: Diagnosis not present

## 2018-05-15 ENCOUNTER — Other Ambulatory Visit: Payer: Self-pay | Admitting: Gastroenterology

## 2018-05-15 DIAGNOSIS — Z86010 Personal history of colonic polyps: Secondary | ICD-10-CM

## 2018-07-02 ENCOUNTER — Ambulatory Visit
Admission: RE | Admit: 2018-07-02 | Discharge: 2018-07-02 | Disposition: A | Discharge status: Home / Self Care | Payer: BLUE CROSS/BLUE SHIELD | Source: Ambulatory Visit | Attending: Gastroenterology | Admitting: Gastroenterology

## 2018-07-02 DIAGNOSIS — Z86010 Personal history of colonic polyps: Secondary | ICD-10-CM

## 2018-07-02 DIAGNOSIS — K4090 Unilateral inguinal hernia, without obstruction or gangrene, not specified as recurrent: Secondary | ICD-10-CM | POA: Diagnosis not present

## 2018-07-09 DIAGNOSIS — E7800 Pure hypercholesterolemia, unspecified: Secondary | ICD-10-CM | POA: Diagnosis not present

## 2018-07-09 DIAGNOSIS — I10 Essential (primary) hypertension: Secondary | ICD-10-CM | POA: Diagnosis not present

## 2018-07-09 DIAGNOSIS — Z794 Long term (current) use of insulin: Secondary | ICD-10-CM | POA: Diagnosis not present

## 2018-07-09 DIAGNOSIS — E1169 Type 2 diabetes mellitus with other specified complication: Secondary | ICD-10-CM | POA: Diagnosis not present

## 2019-01-21 DIAGNOSIS — I2510 Atherosclerotic heart disease of native coronary artery without angina pectoris: Secondary | ICD-10-CM | POA: Diagnosis not present

## 2019-01-21 DIAGNOSIS — E7800 Pure hypercholesterolemia, unspecified: Secondary | ICD-10-CM | POA: Diagnosis not present

## 2019-01-21 DIAGNOSIS — I700 Atherosclerosis of aorta: Secondary | ICD-10-CM | POA: Diagnosis not present

## 2019-01-21 DIAGNOSIS — E1169 Type 2 diabetes mellitus with other specified complication: Secondary | ICD-10-CM | POA: Diagnosis not present

## 2019-09-23 DIAGNOSIS — I10 Essential (primary) hypertension: Secondary | ICD-10-CM | POA: Diagnosis not present

## 2019-09-23 DIAGNOSIS — Z79899 Other long term (current) drug therapy: Secondary | ICD-10-CM | POA: Diagnosis not present

## 2019-09-23 DIAGNOSIS — E1169 Type 2 diabetes mellitus with other specified complication: Secondary | ICD-10-CM | POA: Diagnosis not present

## 2019-09-23 DIAGNOSIS — E7800 Pure hypercholesterolemia, unspecified: Secondary | ICD-10-CM | POA: Diagnosis not present

## 2019-09-23 DIAGNOSIS — Z125 Encounter for screening for malignant neoplasm of prostate: Secondary | ICD-10-CM | POA: Diagnosis not present

## 2020-03-25 DIAGNOSIS — M1712 Unilateral primary osteoarthritis, left knee: Secondary | ICD-10-CM | POA: Diagnosis not present

## 2020-03-25 DIAGNOSIS — M170 Bilateral primary osteoarthritis of knee: Secondary | ICD-10-CM | POA: Diagnosis not present

## 2020-03-25 DIAGNOSIS — M25561 Pain in right knee: Secondary | ICD-10-CM | POA: Diagnosis not present

## 2020-04-07 DIAGNOSIS — E7800 Pure hypercholesterolemia, unspecified: Secondary | ICD-10-CM | POA: Diagnosis not present

## 2020-04-07 DIAGNOSIS — I10 Essential (primary) hypertension: Secondary | ICD-10-CM | POA: Diagnosis not present

## 2020-04-07 DIAGNOSIS — E1169 Type 2 diabetes mellitus with other specified complication: Secondary | ICD-10-CM | POA: Diagnosis not present

## 2020-06-11 IMAGING — CT CT VIRTUAL COLONOSCOPY DIAGNOSTIC
2 of 9 series · 12 of 46 positions shown, 18 images · non-contrast
Comparison: Report of optical colonoscopy of 06/06/2005.

CLINICAL DATA: Failed optical colonoscopy. Asymptomatic with
history of transanal resection of adenocarcinoma in situ of the
rectum. History of colon polyps.

EXAM:
CT VIRTUAL COLONOSCOPY DIAGNOSTIC
TECHNIQUE: The patient was given a standard bowel preparation with Gastrografin
and barium for fluid and stool tagging respectively. The quality of
the bowel preparation is excellent. Automated CO2 insufflation of
the colon was performed prior to image acquisition and colonic
distention is moderate. Image post processing was used to generate a
3D endoluminal fly-through projection of the colon and to
electronically subtract stool/fluid as appropriate.

[Series 5: supine colon 3.00 br40 s3 cor cor supine · coronal · 0.93mm/px · 3 of 128 slices shown]
[im 32/128  soft-tissue]
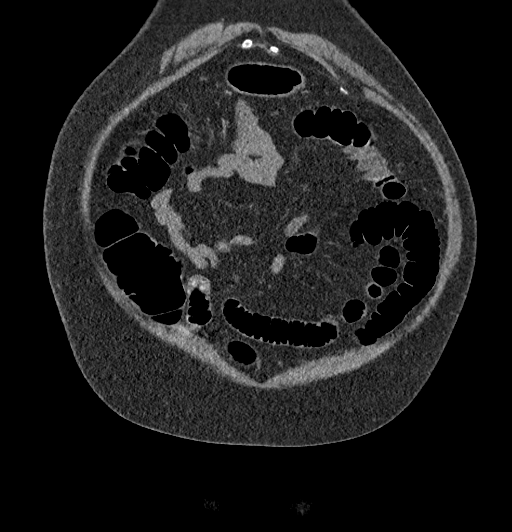
[im 64/128  soft-tissue]
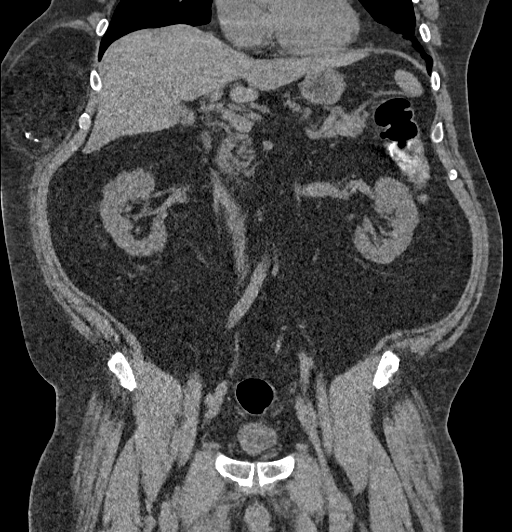
[im 96/128  soft-tissue]
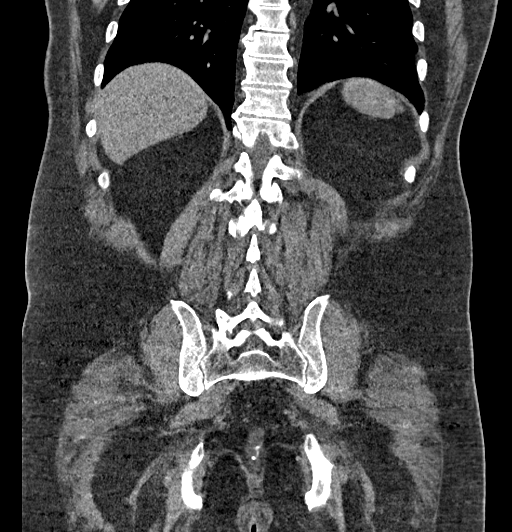

[Series 10: prone colon 1.50 br40 s3 prone thin · axial · 0.97mm/px · z∈[+1101,+1527]mm · 9 of 356 slices shown, 15 images]
[im 36/356  soft-tissue]
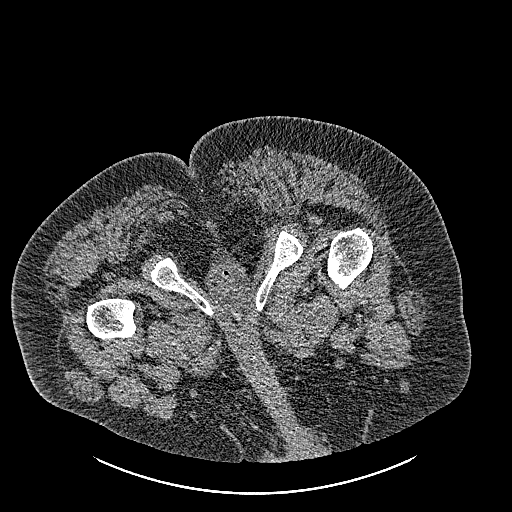
[im 36/356  bone]
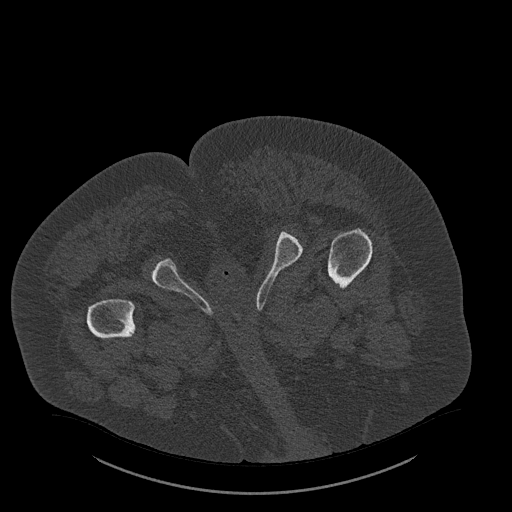
[im 72/356  soft-tissue]
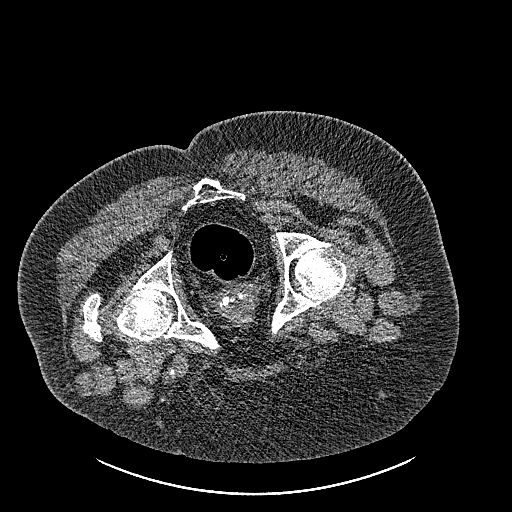
[im 107/356  soft-tissue]
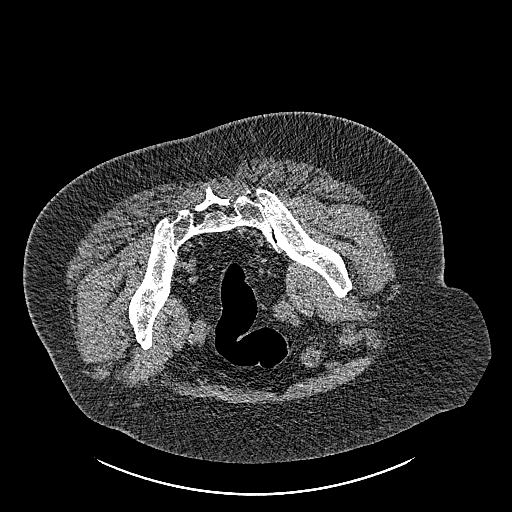
[im 143/356  soft-tissue]
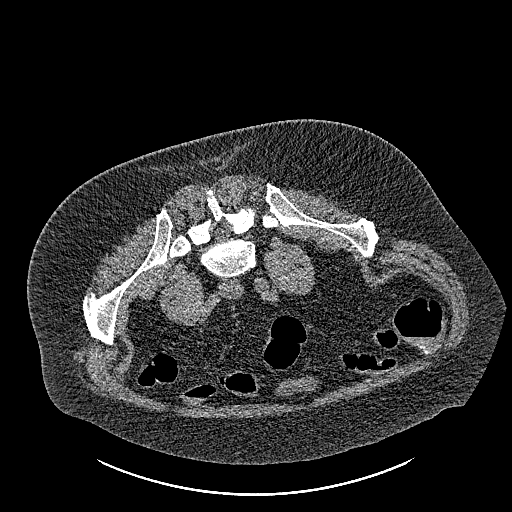
[im 178/356  soft-tissue]
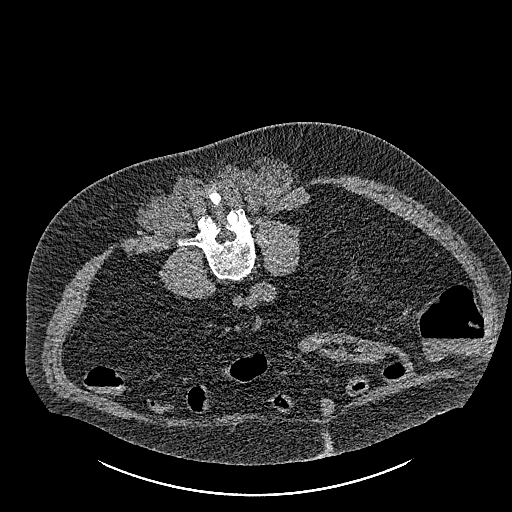
[im 214/356  soft-tissue]
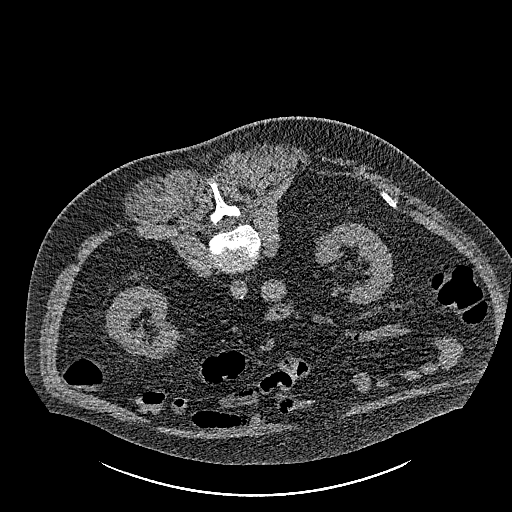
[im 214/356  lung]
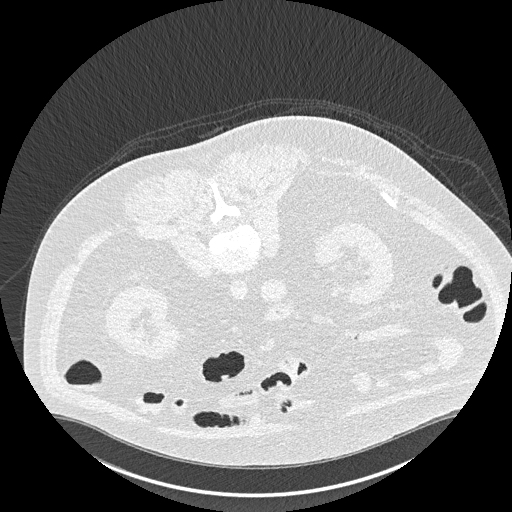
[im 249/356  soft-tissue]
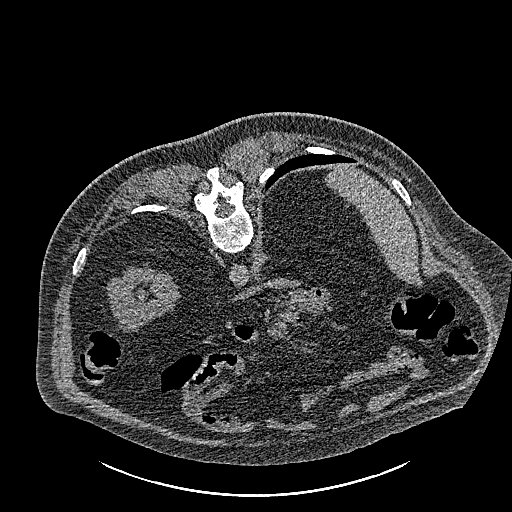
[im 249/356  lung]
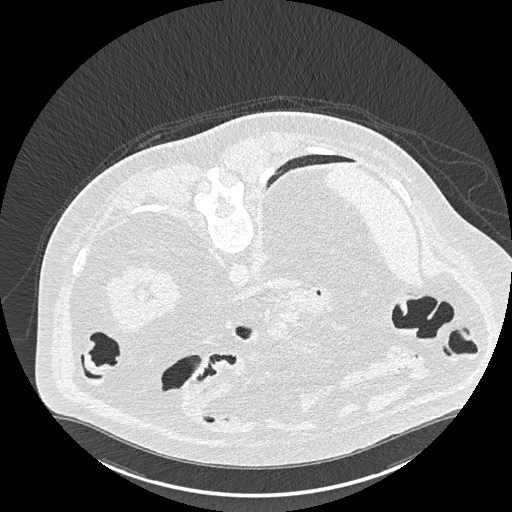
[im 285/356  soft-tissue]
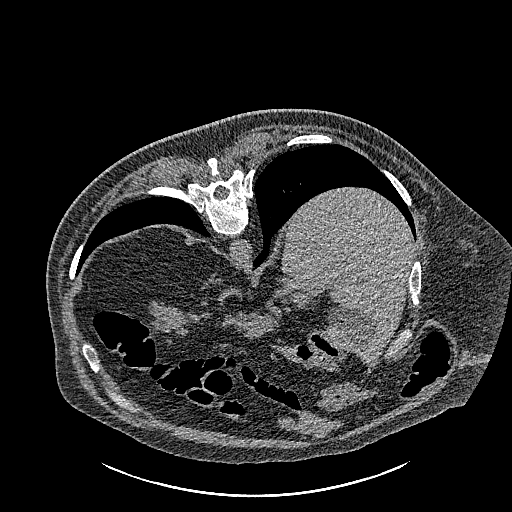
[im 285/356  lung]
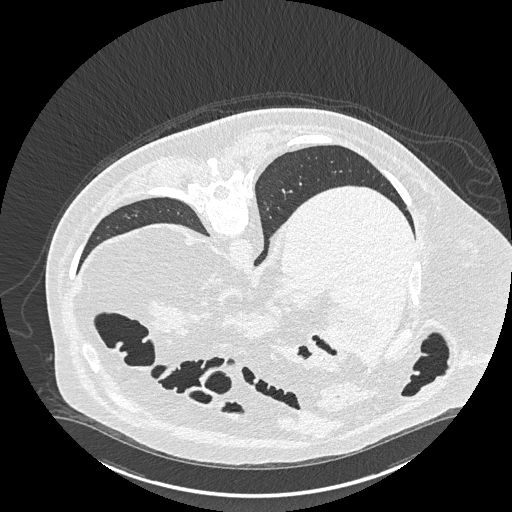
[im 320/356  soft-tissue]
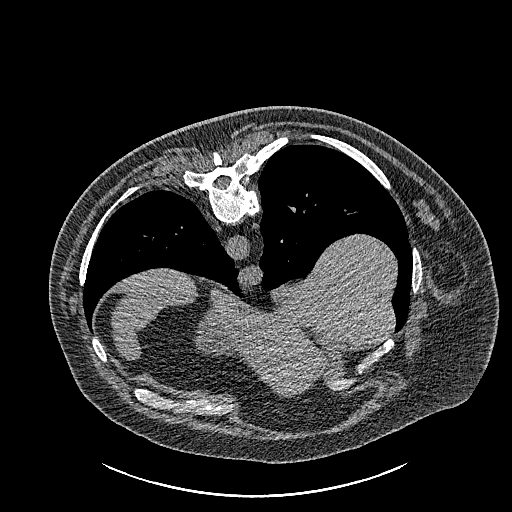
[im 320/356  lung]
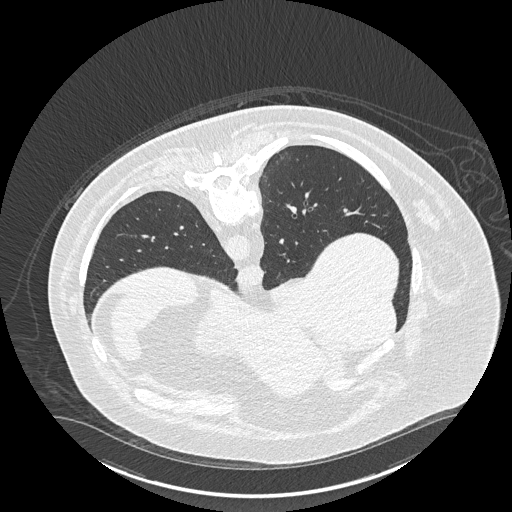
[im 320/356  bone]
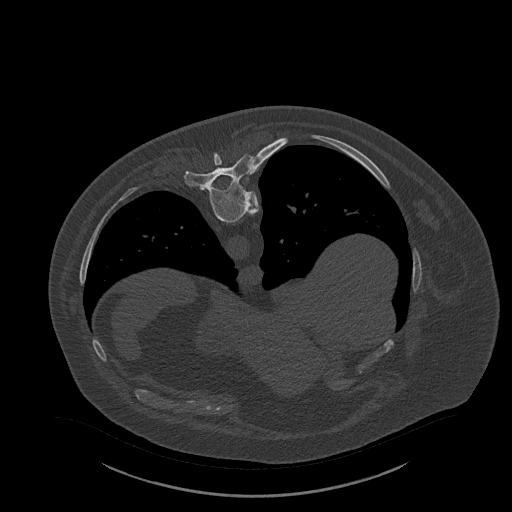

[12 of 46 positions shown; findings below may reference images not displayed]

FINDINGS: VIRTUAL COLONOSCOPY

Areas of underdistention are most significant on prone imaging,
including within the descending and transverse colon on series 12.
An area of apparent fold thickening in the descending colon on image
57/12 prone is favored to be due to underdistention. Given these
mild limitations, no evidence of clinically significant colonic
polyp or mass. Presumed surgical or biopsy site in the posterior
rectum on image 287/3. Scattered colonic diverticula.

Virtual colonoscopy is not designed to detect diminutive polyps
(i.e., less than or equal to 5 mm), the presence or absence of which
may not affect clinical management.

CT ABDOMEN AND PELVIS WITHOUT CONTRAST

Lower chest: Mild subsegmental atelectasis at the left lung base.
Normal heart size without pericardial or pleural effusion.
Multivessel coronary artery atherosclerosis.

Hepatobiliary:Normal liver. Normal gallbladder, without biliary
ductal dilatation.

Pancreas:  Normal, without mass or ductal dilatation.

Spleen: Normal in size, without focal abnormality.

Adrenals/Urinary Tract: Normal adrenal glands. No renal calculi or
hydronephrosis. No hydroureter or ureteric calculi. No bladder
calculi.

Stomach/Bowel: Normal stomach, without wall thickening. Normal
terminal ileum and appendix. Normal small bowel.

Vascular/Lymphatic: Aortic and branch vessel atherosclerosis. No
abdominopelvic adenopathy.

Reproductive: Prostatic calcifications.

Other: No significant free fluid. Small fat containing inguinal
hernias bilaterally. Fat containing ventral abdominal wall hernia
including on image 78/4.

Musculoskeletal: Right lateral thoracoabdominal wall fat, soft
tissue, and calcific density lesion measures 11.7 x 7.7 by 12.2 cm,
including on image [DATE] and 66/5.

No acute osseous abnormality.
IMPRESSION: 1. No evidence of clinically significant colonic polyp or mass,
given mild limitations above.
2. Primarily fat density lesion within the lateral right
thoracoabdominal wall with smaller foci of soft tissue and calcific
density. Most likely a lipoma. Given areas of increased density
within, technically indeterminate. Unless there is remote imaging to
confirm stability, consider further evaluation with dedicated pre
and post-contrast MRI.
3. Coronary artery atherosclerosis. Aortic Atherosclerosis
([id number].M).

## 2020-08-03 DIAGNOSIS — E1169 Type 2 diabetes mellitus with other specified complication: Secondary | ICD-10-CM | POA: Diagnosis not present

## 2020-08-26 DIAGNOSIS — J069 Acute upper respiratory infection, unspecified: Secondary | ICD-10-CM | POA: Diagnosis not present

## 2020-08-26 DIAGNOSIS — R197 Diarrhea, unspecified: Secondary | ICD-10-CM | POA: Diagnosis not present

## 2020-11-30 DIAGNOSIS — E1169 Type 2 diabetes mellitus with other specified complication: Secondary | ICD-10-CM | POA: Diagnosis not present

## 2020-11-30 DIAGNOSIS — E7800 Pure hypercholesterolemia, unspecified: Secondary | ICD-10-CM | POA: Diagnosis not present

## 2020-11-30 DIAGNOSIS — I10 Essential (primary) hypertension: Secondary | ICD-10-CM | POA: Diagnosis not present

## 2020-11-30 DIAGNOSIS — M170 Bilateral primary osteoarthritis of knee: Secondary | ICD-10-CM | POA: Diagnosis not present

## 2020-11-30 DIAGNOSIS — Z79899 Other long term (current) drug therapy: Secondary | ICD-10-CM | POA: Diagnosis not present

## 2020-11-30 DIAGNOSIS — Z23 Encounter for immunization: Secondary | ICD-10-CM | POA: Diagnosis not present

## 2021-06-07 DIAGNOSIS — E1169 Type 2 diabetes mellitus with other specified complication: Secondary | ICD-10-CM | POA: Diagnosis not present

## 2021-06-07 DIAGNOSIS — I10 Essential (primary) hypertension: Secondary | ICD-10-CM | POA: Diagnosis not present

## 2021-06-07 DIAGNOSIS — Z125 Encounter for screening for malignant neoplasm of prostate: Secondary | ICD-10-CM | POA: Diagnosis not present

## 2021-06-07 DIAGNOSIS — E7800 Pure hypercholesterolemia, unspecified: Secondary | ICD-10-CM | POA: Diagnosis not present

## 2021-07-05 ENCOUNTER — Encounter (INDEPENDENT_AMBULATORY_CARE_PROVIDER_SITE_OTHER): Payer: Self-pay

## 2021-07-05 DIAGNOSIS — K429 Umbilical hernia without obstruction or gangrene: Secondary | ICD-10-CM | POA: Diagnosis not present

## 2021-07-05 DIAGNOSIS — D171 Benign lipomatous neoplasm of skin and subcutaneous tissue of trunk: Secondary | ICD-10-CM | POA: Diagnosis not present

## 2021-07-19 ENCOUNTER — Other Ambulatory Visit: Payer: Self-pay

## 2021-07-19 ENCOUNTER — Encounter (INDEPENDENT_AMBULATORY_CARE_PROVIDER_SITE_OTHER): Payer: BC Managed Care – PPO | Admitting: Ophthalmology

## 2021-07-19 ENCOUNTER — Ambulatory Visit (INDEPENDENT_AMBULATORY_CARE_PROVIDER_SITE_OTHER): Payer: BC Managed Care – PPO | Admitting: Ophthalmology

## 2021-07-19 ENCOUNTER — Encounter (INDEPENDENT_AMBULATORY_CARE_PROVIDER_SITE_OTHER): Payer: Self-pay | Admitting: Ophthalmology

## 2021-07-19 DIAGNOSIS — Z8669 Personal history of other diseases of the nervous system and sense organs: Secondary | ICD-10-CM | POA: Insufficient documentation

## 2021-07-19 DIAGNOSIS — E113393 Type 2 diabetes mellitus with moderate nonproliferative diabetic retinopathy without macular edema, bilateral: Secondary | ICD-10-CM | POA: Diagnosis not present

## 2021-07-19 DIAGNOSIS — H2513 Age-related nuclear cataract, bilateral: Secondary | ICD-10-CM | POA: Insufficient documentation

## 2021-07-19 DIAGNOSIS — H401412 Capsular glaucoma with pseudoexfoliation of lens, right eye, moderate stage: Secondary | ICD-10-CM | POA: Diagnosis not present

## 2021-07-19 NOTE — Assessment & Plan Note (Signed)
Likely need cataract surgery in the near future particularly as the right eye has early signs of pseudoexfoliation which I explained to the patient poses a slight increased risk of surgery performance and thus doing that surgery prior to the hardening of the lens makes it safer long-term for his care. ?

## 2021-07-19 NOTE — Assessment & Plan Note (Signed)
OD, I explained the patient that earlier intervention is typically more beneficial and easier with better surgical outcomes ? ?We will refer to [facility], Dr. Velna Ochs, Dr. Philis Kendall, or Dr. Roselyn Meier for total evaluation of his eye care as his recent eye care was performed in the office with a technician and only via remote physician consultation for which he reports will no longer accept that type of evaluation ?

## 2021-07-19 NOTE — Progress Notes (Signed)
07/19/2021     CHIEF COMPLAINT Patient presents for  Chief Complaint  Patient presents with   Retina Evaluation      HISTORY OF PRESENT ILLNESS: Andrew [name] is a 66 y.o. male who presents to the clinic today for:   HPI     Retina Evaluation           Laterality: right eye         Comments   NP- hx of retinal detachment. Eval from Dr. Arvil Chaco at [facility] last seen by Dr. Zadie Rhine approx. 10 years ago. "There is nothing wrong with my vision as far as I can tell, I went for my yearly appointment at [facility] but I am not going back there now. They took pictures of the back of my eye but they couldn't compare it to my last year's pictures. I had a scleral buckle done in my right eye by Dr. Zadie Rhine years ago and that is what they were concerned about." Pt states his medication list has changed. I do not have the ability to update the medication list.      Last edited by Laurin Coder on 07/19/2021  9:14 AM.      Referring physician: Kathyrn Lass, MD [place],  [place] [id number]  HISTORICAL INFORMATION:   Selected notes from the [facility]: No current outpatient medications on file. (Ophthalmic Drugs)   No current facility-administered medications for this visit. (Ophthalmic Drugs)   Current Outpatient Medications (Other)  Medication Sig   aspirin EC 325 MG tablet Take 1 tablet (325 mg total) by mouth 2 (two) times daily.   cephALEXin (KEFLEX) 500 MG capsule Take 1 capsule (500 mg total) by mouth 3 (three) times daily.   HYDROcodone-acetaminophen (NORCO) 7.5-325 MG tablet Take 1-2 tablets by mouth every 4 (four) hours as needed for moderate pain.   insulin detemir (LEVEMIR) 100 UNIT/ML injection Inject 32 Units into the skin at bedtime.   lisinopril (PRINIVIL,ZESTRIL) 10 MG tablet Take 10 mg by mouth every morning.   meloxicam (MOBIC) 15 MG tablet Take 15 mg by mouth daily.   metFORMIN (GLUCOPHAGE) 1000  MG tablet Take 1,000 mg by mouth 2 (two) times daily with a meal. Takes one tablet in am /  1 1/2 tablets in pm   metoprolol succinate (TOPROL-XL) 100 MG 24 hr tablet Take 100 mg by mouth every morning. Take with or immediately following a meal.   pioglitazone (ACTOS) 45 MG tablet Take 45 mg by mouth every morning.   pravastatin (PRAVACHOL) 80 MG tablet Take 80 mg by mouth every morning.   No current facility-administered medications for this visit. (Other)      REVIEW OF SYSTEMS: ROS   Negative for: Constitutional, Gastrointestinal, Neurological, Skin, Genitourinary, Musculoskeletal, HENT, Endocrine, Cardiovascular, Eyes, Respiratory, Psychiatric, Allergic/Imm, Heme/Lymph Last edited by Hurman Horn, MD on 07/19/2021 10:20 AM.       ALLERGIES No Known Allergies  PAST MEDICAL HISTORY Past Medical History:  Diagnosis Date   Acute meniscal tear of left knee    At risk for sleep apnea    STOP-BANG=  5      SENT TO PCP 08-26-2016   History of carcinoma in situ of rectum    07-13-2005   History of colon polyps    03-16-2009  hyperplastic    Hyperlipidemia    Hypertension    OA (osteoarthritis)  left knee, fingers   Type 2 diabetes mellitus ([place])    Wears glasses    Past Surgical History:  Procedure Laterality Date   ACHILLES TENDON REPAIR Left 2013  approx.   COLONOSCOPY  last one 2015   KNEE ARTHROSCOPY WITH MEDIAL MENISECTOMY Left 09/01/2016   Procedure: Left knee arthroscopy, partial medial meniscectomy, chondroplasty, arthroscopic assisted internal fixation of medial femoral condyle and medial tibial plateau;  Surgeon: Sydnee Cabal, MD;  Location: [facility];  Service: Orthopedics;  Laterality: Left;   RETINAL DETACHMENT SURGERY Right 2012  approx.   SHOULDER ARTHROSCOPY Left 2010 approx.   TRANSANAL RECTAL EXCISIONAL BX  07/13/2005   adenocarcinoma in situ    FAMILY HISTORY History reviewed. No pertinent family history.  SOCIAL HISTORY Social  History   Tobacco Use   Smoking status: Former    Years: 21.00    Types: Cigarettes    Quit date: 08/27/1991    Years since quitting: 29.9   Smokeless tobacco: Never  Substance Use Topics   Alcohol use: No   Drug use: No         OPHTHALMIC EXAM:  Base Eye Exam     Visual Acuity (ETDRS)       Right Left   Dist cc 20/20 20/20    Correction: Glasses         Tonometry (Tonopen, 9:16 AM)       Right Left   Pressure 17 18         Pupils       Pupils Dark Light APD   Right PERRL 4 3 None   Left PERRL 4 3 None         Visual Fields (Counting fingers)       Left Right    Full Full         Extraocular Movement       Right Left    Full Full         Neuro/Psych     Oriented x3: Yes   Mood/Affect: Normal         Dilation     Both eyes: 1.0% Mydriacyl, 2.5% Phenylephrine @ 9:16 AM           Slit Lamp and Fundus Exam     External Exam       Right Left   External Normal Normal         Slit Lamp Exam       Right Left   Lids/Lashes Normal Normal   Conjunctiva/Sclera White and quiet White and quiet   Cornea Clear Clear   Anterior Chamber Deep and quiet Deep and quiet   Iris Round and reactive Round and reactive   Lens 2+ Nuclear sclerosis, 2+ Pseudoexfoliation 2+ Nuclear sclerosis   Anterior Vitreous Normal Normal         Fundus Exam       Right Left   Posterior Vitreous Normal Normal   Disc Normal Normal   C/D Ratio 0.45 0.45   Macula Normal Normal   Vessels NPDR- Moderate NPDR- Moderate   Periphery Good scleral buckle, 360, with good chorioretinal scarring, cryopexy OD, no new breaks good temporal chorioretinal scarring OD. No retinal holes or tears seen.            IMAGING AND PROCEDURES  Imaging and Procedures for 07/19/21  OCT, Retina - OU - Both Eyes       Right Eye Quality was good. Scan locations included subfoveal. Central  Foveal Thickness: 332. Progression has no prior data. Findings include normal  foveal contour.   Left Eye Quality was good. Scan locations included subfoveal. Central Foveal Thickness: 322. Progression has no prior data. Findings include normal foveal contour.   Notes No active maculopathy OU     Color Fundus Photography Optos - OU - Both Eyes       Right Eye Progression has no prior data. Disc findings include normal observations, increased cup to disc ratio. Macula : microaneurysms.   Left Eye Progression has no prior data. Disc findings include normal observations, increased cup to disc ratio. Macula : microaneurysms.   Notes Moderate NPDR OU.  OD with history of retinal detachment, no signs of retinal hole or tear today OD  OS also clear media             ASSESSMENT/PLAN:  Nuclear sclerotic cataract of both eyes Likely need cataract surgery in the near future particularly as the right eye has early signs of pseudoexfoliation which I explained to the patient poses a slight increased risk of surgery performance and thus doing that surgery prior to the hardening of the lens makes it safer long-term for his care.  Capsular glaucoma with lens pseudoexfoliation, right, moderate stage OD, I explained the patient that earlier intervention is typically more beneficial and easier with better surgical outcomes  We will refer to [facility], Dr. Barbie Haggis, Dr. Asencion Islam, or Dr. Eston Mould for total evaluation of his eye care as his recent eye care was performed in the office with a technician and only via remote physician consultation for which he reports will no longer accept that type of evaluation     ICD-10-CM   1. 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 History of retinal detachment  Z86.69 OCT, Retina - OU - Both Eyes    Color  Fundus Photography Optos - OU - Both Eyes    3. Nuclear sclerotic cataract of both eyes  H25.13     4. Capsular glaucoma with lens pseudoexfoliation, right, moderate stage  H40.1412       1.  OU, with moderate nonproliferative diabetic retinopathy monitor will observe  2.  OD, history of retinal detachment, stable looks great no new breaks  OS no retinal holes or tears  3.  OU with cataract but more pronounced in the right eye, and my concern is a presence of pseudoexfoliation.  Recommend complete evaluation perhaps a [facility] consideration for cataract traction with intraocular lens placement of the right eye initially so as to prevent or minimize cataract surgical risk with the presence of pseudoexfoliation  Ophthalmic Meds Ordered this visit:  No orders of the defined types were placed in this encounter.      Return in about 1 year (around 07/20/2022).  There are no Patient Instructions on file for this visit.   Explained the diagnoses, plan, and follow up with the patient and they expressed understanding.  Patient expressed understanding of the importance of proper follow up care.   Clent Demark Fortino Haag M.D. Diseases & Surgery of the Retina and Vitreous [facility] [place] 07/19/21     Abbreviations: M myopia (nearsighted); A astigmatism; H hyperopia (farsighted); P presbyopia; Mrx spectacle prescription;  CTL contact lenses; OD right  eye; OS left eye; OU both eyes  XT exotropia; ET esotropia; PEK punctate epithelial keratitis; PEE punctate epithelial erosions; DES dry eye syndrome; MGD meibomian gland dysfunction; ATs artificial tears; PFAT's preservative free artificial tears; [place] nuclear sclerotic cataract; PSC posterior subcapsular cataract; ERM epi-retinal membrane; PVD posterior vitreous detachment; RD retinal detachment; DM diabetes mellitus; DR diabetic retinopathy; NPDR non-proliferative diabetic retinopathy; PDR proliferative diabetic retinopathy;  CSME clinically significant macular edema; DME diabetic macular edema; dbh dot blot hemorrhages; CWS cotton wool spot; POAG primary open angle glaucoma; C/D cup-to-disc ratio; HVF humphrey visual field; GVF goldmann visual field; OCT optical coherence tomography; IOP intraocular pressure; BRVO Branch retinal vein occlusion; CRVO central retinal vein occlusion; CRAO central retinal artery occlusion; BRAO branch retinal artery occlusion; RT retinal tear; SB scleral buckle; PPV pars plana vitrectomy; VH Vitreous hemorrhage; PRP panretinal laser photocoagulation; IVK intravitreal kenalog; VMT vitreomacular traction; MH Macular hole;  NVD neovascularization of the disc; NVE neovascularization elsewhere; AREDS age related eye disease study; ARMD age related macular degeneration; POAG primary open angle glaucoma; EBMD epithelial/anterior basement membrane dystrophy; ACIOL anterior chamber intraocular lens; IOL intraocular lens; PCIOL posterior chamber intraocular lens; Phaco/IOL phacoemulsification with intraocular lens placement; [place] photorefractive keratectomy; LASIK laser assisted in situ keratomileusis; HTN hypertension; DM diabetes mellitus; COPD chronic obstructive pulmonary disease

## 2021-08-23 DIAGNOSIS — N5201 Erectile dysfunction due to arterial insufficiency: Secondary | ICD-10-CM | POA: Diagnosis not present

## 2021-08-23 DIAGNOSIS — R351 Nocturia: Secondary | ICD-10-CM | POA: Diagnosis not present

## 2021-08-25 ENCOUNTER — Other Ambulatory Visit: Payer: Self-pay | Admitting: Surgery

## 2021-08-25 DIAGNOSIS — D171 Benign lipomatous neoplasm of skin and subcutaneous tissue of trunk: Secondary | ICD-10-CM | POA: Diagnosis not present

## 2021-08-27 ENCOUNTER — Encounter: Payer: Self-pay | Admitting: Surgery

## 2021-09-15 DIAGNOSIS — E1165 Type 2 diabetes mellitus with hyperglycemia: Secondary | ICD-10-CM | POA: Diagnosis not present

## 2021-09-15 DIAGNOSIS — Z1339 Encounter for screening examination for other mental health and behavioral disorders: Secondary | ICD-10-CM | POA: Diagnosis not present

## 2021-09-15 DIAGNOSIS — Z1331 Encounter for screening for depression: Secondary | ICD-10-CM | POA: Diagnosis not present

## 2021-09-21 DIAGNOSIS — N5201 Erectile dysfunction due to arterial insufficiency: Secondary | ICD-10-CM | POA: Diagnosis not present

## 2021-09-30 DIAGNOSIS — R1084 Generalized abdominal pain: Secondary | ICD-10-CM | POA: Diagnosis not present

## 2021-09-30 DIAGNOSIS — M791 Myalgia, unspecified site: Secondary | ICD-10-CM | POA: Diagnosis not present

## 2021-09-30 DIAGNOSIS — I10 Essential (primary) hypertension: Secondary | ICD-10-CM | POA: Diagnosis not present

## 2021-09-30 DIAGNOSIS — E1165 Type 2 diabetes mellitus with hyperglycemia: Secondary | ICD-10-CM | POA: Diagnosis not present

## 2021-10-22 DIAGNOSIS — I10 Essential (primary) hypertension: Secondary | ICD-10-CM | POA: Diagnosis not present

## 2021-11-25 DIAGNOSIS — E1165 Type 2 diabetes mellitus with hyperglycemia: Secondary | ICD-10-CM | POA: Diagnosis not present

## 2021-12-09 DIAGNOSIS — I10 Essential (primary) hypertension: Secondary | ICD-10-CM | POA: Diagnosis not present

## 2021-12-09 DIAGNOSIS — E1165 Type 2 diabetes mellitus with hyperglycemia: Secondary | ICD-10-CM | POA: Diagnosis not present

## 2022-01-24 ENCOUNTER — Encounter (INDEPENDENT_AMBULATORY_CARE_PROVIDER_SITE_OTHER): Payer: Self-pay

## 2022-01-24 DIAGNOSIS — E119 Type 2 diabetes mellitus without complications: Secondary | ICD-10-CM | POA: Diagnosis not present

## 2022-01-24 DIAGNOSIS — E1165 Type 2 diabetes mellitus with hyperglycemia: Secondary | ICD-10-CM | POA: Diagnosis not present

## 2022-01-24 DIAGNOSIS — H43813 Vitreous degeneration, bilateral: Secondary | ICD-10-CM | POA: Diagnosis not present

## 2022-01-24 DIAGNOSIS — H401414 Capsular glaucoma with pseudoexfoliation of lens, right eye, indeterminate stage: Secondary | ICD-10-CM | POA: Diagnosis not present

## 2022-01-24 DIAGNOSIS — I10 Essential (primary) hypertension: Secondary | ICD-10-CM | POA: Diagnosis not present

## 2022-02-10 DIAGNOSIS — I10 Essential (primary) hypertension: Secondary | ICD-10-CM | POA: Diagnosis not present

## 2022-02-10 DIAGNOSIS — E1165 Type 2 diabetes mellitus with hyperglycemia: Secondary | ICD-10-CM | POA: Diagnosis not present

## 2022-05-26 DIAGNOSIS — E1165 Type 2 diabetes mellitus with hyperglycemia: Secondary | ICD-10-CM | POA: Diagnosis not present

## 2022-05-26 DIAGNOSIS — I10 Essential (primary) hypertension: Secondary | ICD-10-CM | POA: Diagnosis not present

## 2022-07-20 ENCOUNTER — Encounter (INDEPENDENT_AMBULATORY_CARE_PROVIDER_SITE_OTHER): Payer: BC Managed Care – PPO | Admitting: Ophthalmology

## 2022-10-31 ENCOUNTER — Ambulatory Visit: Payer: No Typology Code available for payment source | Attending: Cardiology | Admitting: Cardiology

## 2022-10-31 ENCOUNTER — Ambulatory Visit (INDEPENDENT_AMBULATORY_CARE_PROVIDER_SITE_OTHER): Payer: No Typology Code available for payment source

## 2022-10-31 VITALS — BP 134/84 | HR 68 | Ht 70.0 in | Wt 258.0 lb

## 2022-10-31 DIAGNOSIS — I10 Essential (primary) hypertension: Secondary | ICD-10-CM | POA: Diagnosis not present

## 2022-10-31 DIAGNOSIS — D6869 Other thrombophilia: Secondary | ICD-10-CM | POA: Diagnosis not present

## 2022-10-31 DIAGNOSIS — I480 Paroxysmal atrial fibrillation: Secondary | ICD-10-CM

## 2022-10-31 DIAGNOSIS — I2510 Atherosclerotic heart disease of native coronary artery without angina pectoris: Secondary | ICD-10-CM

## 2022-10-31 DIAGNOSIS — Z79899 Other long term (current) drug therapy: Secondary | ICD-10-CM

## 2022-10-31 MED ORDER — APIXABAN 5 MG PO TABS: 5.0000 mg | ORAL_TABLET | Freq: Two times a day (BID) | ORAL | 6 refills | Status: DC

## 2022-10-31 NOTE — Progress Notes (Signed)
Electrophysiology Office Note:   Date:  10/31/2022  ID:  KOLLEN ARMENTI, DOB 01/07/1956, MRN [id number]  Primary Cardiologist: None Electrophysiologist: None      History of Present Illness:   Andrew [name] is a 67 y.o. male with h/o atrial fibrillation seen today for  for Electrophysiology evaluation of atrial fibrillation at the request of Richard Tisovec.    He has intermittent episodes of fatigue and shortness of breath.  He is unsure if these episodes are associated with working out in the heat versus arrhythmia.  He presented for hernia surgery and was found to be in atrial fibrillation at the time.  He is able to do all of his daily activities without restriction.  He does not get short of breath with exertion and has no chest pain.  He did have a CT scan in 2020 for a virtual colonoscopy that did show atherosclerosis.  Today, denies symptoms of palpitations, chest pain, shortness of breath, orthopnea, PND, lower extremity edema, claudication, dizziness, presyncope, syncope, bleeding, or neurologic sequela. The patient is tolerating medications without difficulties.      He has a history of type 2 diabetes, hypertension, obesity, atrial fibrillation.  Atrial fibrillation was diagnosed as a preop visit for hernia surgery.  He does have atherosclerosis found on CT for virtual colonoscopy.      Review of systems complete and found to be negative unless listed in HPI.   Studies Reviewed:    EKG is ordered today. Personal review as below.  EKG Interpretation  Date/Time:  Monday October 31 2022 08:27:28 EDT Ventricular Rate:  68 PR Interval:  290 QRS Duration: 140 QT Interval:  422 QTC Calculation: 448 R Axis:   57 Text Interpretation: Sinus rhythm with 1st degree A-V block Right bundle branch block When compared with ECG of 01-Sep-2016 11:14, Right bundle branch block Confirmed by Darby Shadwick ([id number]) on 10/31/2022 8:34:16 AM    Risk Assessment/Calculations:    CHA2DS2-VASc  Score = 3   This indicates a 3.2% annual risk of stroke. The patient's score is based upon: CHF History: 0 HTN History: 1 Diabetes History: 1 Stroke History: 0 Vascular Disease History: 0 Age Score: 1 Gender Score: 0             Physical Exam:   VS:  BP 134/84   Pulse 68   Ht 5\' 10"  (1.778 m)   Wt 258 lb (117 kg)   SpO2 98%   BMI 37.02 kg/m    Wt Readings from Last 3 Encounters:  10/31/22 258 lb (117 kg)  09/01/16 284 lb (128.8 kg)     GEN: Well nourished, well developed in no acute distress NECK: No JVD; No carotid bruits CARDIAC: Regular rate and rhythm, no murmurs, rubs, gallops RESPIRATORY:  Clear to auscultation without rales, wheezing or rhonchi  ABDOMEN: Soft, non-tender, non-distended EXTREMITIES:  No edema; No deformity   ASSESSMENT AND PLAN:    1.  Paroxysmal atrial fibrillation: Currently on metoprolol.  He is minimally symptomatic from his atrial fibrillation, though he does have intermittent fatigue and shortness of breath.  Stanford Strauch have him wear a 2-week monitor to determine if his symptoms are due to atrial fibrillation.  If this is the initial evaluation, Citlalli Weikel plan for TSH and BMP as well as a transthoracic echo.  Dario Yono start Eliquis today.  2.  Secondary hypercoagulable state: Starting Eliquis for atrial fibrillation  3.  Hypertension: Currently well-controlled  4 coronary artery calcifications: Arihana Ambrocio check fasting lipids.  Goal  LDL less than 70.  5.  Preoperative evaluation: Patient has plans for hernia surgery.  He does not have chest pain or shortness of breath.  He has a new diagnosis of atrial fibrillation.  We are planning for transthoracic echo.  If transthoracic echo is normal, he would be at low to intermediate risk for an intermediate risk procedure.  He would be able to hold his Eliquis at the time of surgery for 2 to 3 days prior.  Follow up with Dr. Elberta Fortis in 3 months  Signed, [place] Jorja Loa, MD

## 2022-10-31 NOTE — Patient Instructions (Signed)
Medication Instructions:  Your physician has recommended you make the following change in your medication:  START Eliquis 5 mg twice a day  *If you need a refill on your cardiac medications before your next appointment, please call your pharmacy*   Lab Work: Today: fasting lipids, BMET, CBC & TSH If you have labs (blood work) drawn today and your tests are completely normal, you will receive your results only by: MyChart Message (if you have MyChart) OR A paper copy in the mail If you have any lab test that is abnormal or we need to change your treatment, we will call you to review the results.   Testing/Procedures: Your physician has requested that you have an echocardiogram. Echocardiography is a painless test that uses sound waves to create images of your heart. It provides your doctor with information about the size and shape of your heart and how well your heart's chambers and valves are working. This procedure takes approximately one hour. There are no restrictions for this procedure. Please do NOT wear cologne, perfume, aftershave, or lotions (deodorant is allowed). Please arrive 15 minutes prior to your appointment time.                            ZIO XT- Long Term Monitor Instructions  Your physician has requested you wear a ZIO patch monitor for 14 days.  This is a single patch monitor. [employer] supplies one patch monitor per enrollment. Additional stickers are not available. Please do not apply patch if you will be having a Nuclear Stress Test,  Echocardiogram, Cardiac CT, MRI, or Chest Xray during the period you would be wearing the  monitor. The patch cannot be worn during these tests. You cannot remove and re-apply the  ZIO XT patch monitor.  Your ZIO patch monitor will be mailed 3 day USPS to your address on file. It may take 3-5 days  to receive your monitor after you have been enrolled.  Once you have received your monitor, please review the enclosed instructions. Your  monitor  has already been registered assigning a specific monitor serial # to you.  Billing and Patient Assistance Program Information  We have supplied [employer] with any of your insurance information on file for billing purposes. [employer] offers a sliding scale Patient Assistance Program for patients that do not have  insurance, or whose insurance does not completely cover the cost of the ZIO monitor.  You must apply for the Patient Assistance Program to qualify for this discounted rate.  To apply, please call [employer] at [phone], select option 4, select option 2, ask to apply for  Patient Assistance Program. Meredeth Ide will ask your household income, and how many people  are in your household. They will quote your out-of-pocket cost based on that information.  [employer] will also be able to set up a 40-month, interest-free payment plan if needed.  Applying the monitor   Shave hair from upper left chest.  Hold abrader disc by orange tab. Rub abrader in 40 strokes over the upper left chest as  indicated in your monitor instructions.  Clean area with 4 enclosed alcohol pads. Let dry.  Apply patch as indicated in monitor instructions. Patch will be placed under collarbone on left  side of chest with arrow pointing upward.  Rub patch adhesive wings for 2 minutes. Remove white label marked "1". Remove the white  label marked "2". Rub patch adhesive wings for 2 additional minutes.  While looking in a mirror, press and release button in center of patch. A small green light will  flash 3-4 times. This will be your only indicator that the monitor has been turned on.  Do not shower for the first 24 hours. You may shower after the first 24 hours.  Press the button if you feel a symptom. You will hear a small click. Record Date, Time and  Symptom in the Patient Logbook.  When you are ready to remove the patch, follow instructions on the last 2 pages of Patient  Logbook. Stick patch monitor onto the  last page of Patient Logbook.  Place Patient Logbook in the blue and white box. Use locking tab on box and tape box closed  securely. The blue and white box has prepaid postage on it. Please place it in the mailbox as  soon as possible. Your physician should have your test results approximately 7 days after the  monitor has been mailed back to [facility].  Call [facility] Customer Care at [phone] if you have questions regarding  your ZIO XT patch monitor. Call them immediately if you see an orange light blinking on your  monitor.  If your monitor falls off in less than 4 days, contact our Monitor department at [phone].  If your monitor becomes loose or falls off after 4 days call [employer] at [phone] for  suggestions on securing your monitor   Follow-Up: At [facility], you and your health needs are our priority.  As part of our continuing mission to provide you with exceptional heart care, we have created designated Provider Care Teams.  These Care Teams include your primary Cardiologist (physician) and Advanced Practice Providers (APPs -  Physician Assistants and Nurse Practitioners) who all work together to provide you with the care you need, when you need it.  Your next appointment:   3 month(s)  The format for your next appointment:   In Person  Provider:   Loman Brooklyn, MD    Thank you for choosing [facility]!!   Dory Horn, RN [phone]  Other Instructions  Apixaban Tablets What is this medication? APIXABAN (a PIX a ban) prevents and treats blood clots. It is also used to lower the risk of stroke in people with AFib (atrial fibrillation). It belongs to a group of medications called blood thinners. This medicine may be used for other purposes; ask your health care provider or pharmacist if you have questions. COMMON BRAND NAME(S): Eliquis What should I tell my care team before I take this medication? They need to know if you have any  of these conditions: Antiphospholipid antibody syndrome Bleeding disorder History of bleeding in the brain History of blood clots History of stomach bleeding Kidney disease Liver disease Mechanical heart valve Spinal surgery An unusual or allergic reaction to apixaban, other medications, foods, dyes, or preservatives Pregnant or trying to get pregnant Breastfeeding How should I use this medication? Take this medication by mouth. For your therapy to work as well as possible, take each dose exactly as prescribed on the prescription label. Do not skip doses. Skipping doses or stopping this medication can increase your risk of a blood clot or stroke. Keep taking this medication unless your care team tells you to stop. Take it as directed on the prescription label at the same time every day. You can take it with or without food. If it upsets your stomach, take it with food. A special MedGuide will be given to you by  the pharmacist with each prescription and refill. Be sure to read this information carefully each time. Talk to your care team about the use of this medication in children. Special care may be needed. Overdosage: If you think you have taken too much of this medicine contact a poison control center or emergency room at once. NOTE: This medicine is only for you. Do not share this medicine with others. What if I miss a dose? If you miss a dose, take it as soon as you can. If it is almost time for your next dose, take only that dose. Do not take double or extra doses. What may interact with this medication? Aspirin and aspirin-like medications Certain medications for fungal infections, such as itraconazole or ketoconazole Certain medications for seizures, such as carbamazepine or phenytoin Certain medications that prevent or treat blood clots, such as enoxaparin, dalteparin, heparin, warfarin Clarithromycin NSAIDs, medications for pain and inflammation, such as ibuprofen or  naproxen Rifampin Ritonavir St. John's wort This list may not describe all possible interactions. Give your health care provider a list of all the medicines, herbs, non-prescription drugs, or dietary supplements you use. Also tell them if you smoke, drink alcohol, or use illegal drugs. Some items may interact with your medicine. What should I watch for while using this medication? Visit your care team for regular checks on your progress. Your condition will be monitored carefully while you are receiving this medication. You may need blood work while taking this medication. Avoid sports and activities that might cause injury while you are using this medication. Severe falls or injuries can cause unseen bleeding. Be careful when using sharp tools or knives. Consider using an Neurosurgeon. Take special care brushing or flossing your teeth. Report any injuries, bruising, or red spots on the skin to your care team. If you are going to need surgery or other procedure, tell your care team that you are taking this medication. Wear a medical ID bracelet or chain. Carry a card that describes your condition. List the medications and doses you take on the card. What side effects may I notice from receiving this medication? Side effects that you should report to your care team as soon as possible: Allergic reactions--skin rash, itching, hives, swelling of the face, lips, tongue, or throat Bleeding--bloody or black, tar-like stools, vomiting blood or brown material that looks like coffee grounds, red or dark brown urine, small red or purple spots on the skin, unusual bruising or bleeding Bleeding in the brain--severe headache, stiff neck, confusion, dizziness, change in vision, numbness or weakness of the face, arm, or leg, trouble speaking, trouble walking, vomiting Heavy periods This list may not describe all possible side effects. Call your doctor for medical advice about side effects. You may report side  effects to FDA at 1-800-FDA-1088. Where should I keep my medication? Keep out of the reach of children and pets. Store at room temperature between 20 and 25 degrees C (68 and 77 degrees F). Get rid of any unused medication after the expiration date. To get rid of medications that are no longer needed or expired: Take the medication to a medication take-back program. Check with your pharmacy or law enforcement to find a location. If you cannot return the medication, check the label or package insert to see if the medication should be thrown out in the garbage or flushed down the toilet. If you are not sure, ask your care team. If it is safe to put in the trash, empty  the medication out of the container. Mix the medication with cat litter, dirt, coffee grounds, or other unwanted substance. Seal the mixture in a bag or container. Put it in the trash. NOTE: This sheet is a summary. It may not cover all possible information. If you have questions about this medicine, talk to your doctor, pharmacist, or health care provider.  2024 Elsevier/Gold Standard (2022-01-24 00:00:00)

## 2022-10-31 NOTE — Progress Notes (Unsigned)
Enrolled for [employer] to mail a ZIO XT long term holter monitor to the patients address on file.  

## 2022-11-01 LAB — BASIC METABOLIC PANEL
BUN/Creatinine Ratio: 16 (ref 10–24)
BUN: 14 mg/dL (ref 8–27)
CO2: 20 mmol/L (ref 20–29)
Calcium: 9.6 mg/dL (ref 8.6–10.2)
Chloride: 101 mmol/L (ref 96–106)
Creatinine, Ser: 0.86 mg/dL (ref 0.76–1.27)
Glucose: 142 mg/dL — ABNORMAL HIGH (ref 70–99)
Potassium: 4.4 mmol/L (ref 3.5–5.2)
Sodium: 137 mmol/L (ref 134–144)
eGFR: 95 mL/min/{1.73_m2} (ref >59)

## 2022-11-01 LAB — CBC
Hematocrit: 47.3 % (ref 37.5–51.0)
Hemoglobin: 16.4 g/dL (ref 13.0–17.7)
MCH: 31.5 pg (ref 26.6–33.0)
MCHC: 34.7 g/dL (ref 31.5–35.7)
MCV: 91 fL (ref 79–97)
Platelets: 345 10*3/uL (ref 150–450)
RBC: 5.2 x10E6/uL (ref 4.14–5.80)
RDW: 12.4 % (ref 11.6–15.4)
WBC: 8.8 10*3/uL (ref 3.4–10.8)

## 2022-11-01 LAB — LIPID PANEL
Chol/HDL Ratio: 5.1 ratio — ABNORMAL HIGH (ref 0.0–5.0)
Cholesterol, Total: 179 mg/dL (ref 100–199)
HDL: 35 mg/dL — ABNORMAL LOW (ref >39)
LDL Chol Calc (NIH): 123 mg/dL — ABNORMAL HIGH (ref 0–99)
Triglycerides: 117 mg/dL (ref 0–149)
VLDL Cholesterol Cal: 21 mg/dL (ref 5–40)

## 2022-11-01 LAB — TSH: TSH: 1.22 u[IU]/mL (ref 0.450–4.500)

## 2022-11-02 DIAGNOSIS — I480 Paroxysmal atrial fibrillation: Secondary | ICD-10-CM | POA: Diagnosis not present

## 2022-11-07 ENCOUNTER — Telehealth: Payer: Self-pay | Admitting: *Deleted

## 2022-11-07 MED ORDER — ROSUVASTATIN CALCIUM 40 MG PO TABS: 40.0000 mg | ORAL_TABLET | Freq: Every day | ORAL | 6 refills | Status: DC

## 2022-11-07 NOTE — Telephone Encounter (Signed)
-----   Message from Will Jorja Loa, MD sent at 11/01/2022  7:56 AM EDT ----- Stable labs Start Crestor 40 mg for coronary calcium

## 2022-11-07 NOTE — Telephone Encounter (Signed)
Informed patient of results and verbal understanding expressed. Pt agreeable to trying/starting Crestor. He will let us know if SE occur after medication change, states he is very sensitive to statins. Aware to stop Pravastatin.

## 2022-11-22 ENCOUNTER — Encounter: Payer: Self-pay | Admitting: Cardiology

## 2022-11-22 ENCOUNTER — Ambulatory Visit (HOSPITAL_COMMUNITY): Payer: No Typology Code available for payment source | Attending: Cardiology

## 2022-11-22 DIAGNOSIS — I480 Paroxysmal atrial fibrillation: Secondary | ICD-10-CM | POA: Diagnosis not present

## 2022-11-22 LAB — ECHOCARDIOGRAM COMPLETE
Area-P 1/2: 2.58 cm2
S' Lateral: 2.9 cm

## 2022-11-24 ENCOUNTER — Telehealth: Payer: Self-pay | Admitting: Cardiology

## 2022-11-24 NOTE — Telephone Encounter (Signed)
Spoke to pt and informed of echocardiogram findings.  Aware ok to proceed with procedure. Advised to have surgeon's office fax clearance request to our office.   Informed that I would reply to his mychart message with office fax number.  He appreciates the info/assistance.

## 2022-11-24 NOTE — Telephone Encounter (Signed)
New Message:     Zollie Scale from [facility] is calling with abnormal results.

## 2022-11-24 NOTE — Telephone Encounter (Signed)
Andrew [name] with [employer] called to report the pts Zio revealed a slow Afib rate 39 lasting 60 seconds on pg. 10, day 4.   Will forward to Dr Nobie Putnam for review.   Pt is known Afib and covered on Eliquis.

## 2022-11-29 ENCOUNTER — Telehealth: Payer: Self-pay | Admitting: *Deleted

## 2022-11-29 NOTE — Telephone Encounter (Signed)
   Pre-operative Risk Assessment    Patient Name: Andrew [name]  DOB: 08/07/1955 MRN: [id number]      Request for Surgical Clearance    Procedure:   LAPAROSCOPIC VENTRAL HERNIA REPAIR WITH MESH  Date of Surgery:  Clearance TBD                                 Surgeon:  DR. Trinidad Curet Surgeon's Group or Practice Name:  [facility] Phone number:  [phone] Fax number:  [phone]   Type of Clearance Requested:   - Medical  - Pharmacy:  Hold Apixaban (Eliquis)     Type of Anesthesia:  Not Indicated (GENERAL?)   Additional requests/questions:    Andrew [name]   11/29/2022, 3:46 PM

## 2022-11-29 NOTE — Telephone Encounter (Signed)
   Patient Name: Andrew [name]  DOB: 07/10/1955 MRN: [id number]  Primary Cardiologist: None  Chart reviewed as part of pre-operative protocol coverage. Given past medical history and time since last visit, based on ACC/AHA guidelines, Andrew [name] is at acceptable risk for the planned procedure without further cardiovascular testing.   Patient was last seen in the office on 10/31/2022 by Dr. Elberta Fortis and was cleared for surgery at the time ending echocardiogram results.  Echocardiogram was stable.   Per Dr. Lennart Pall would be able to hold his Eliquis at the time of surgery for 2 to 3 days prior."  Please resume Eliquis as soon as possible postprocedure, the discretion of the surgeon.  I will route this recommendation to the requesting party via Epic fax function and remove from pre-op pool.  Please call with questions.  Joylene Grapes, NP 11/29/2022, 4:10 PM

## 2022-12-05 ENCOUNTER — Telehealth: Payer: Self-pay | Admitting: Cardiology

## 2022-12-05 ENCOUNTER — Encounter: Payer: Self-pay | Admitting: *Deleted

## 2022-12-05 ENCOUNTER — Telehealth: Payer: Self-pay | Admitting: *Deleted

## 2022-12-05 NOTE — Telephone Encounter (Signed)
Pt calling to get the pre op team to send over confirmation for his pre op clearance for a procedure. The phone note is showing it was sent over 7/23 and the pt is stating that the office did not receive an answer from [place] regarding the clearance. Please advise.

## 2022-12-05 NOTE — Telephone Encounter (Signed)
I s/w the pt and he said the requesting office stated to him that they still have not received our notes providing clearance. I did confirm the fax # with the pt that the surgeon's office gave him.    I did also send to [facility] CHART for the pt as well. Pt thanked me for the help.    I assured the pt that I will re-fax again to surgeon's office again today.            Tag   Copy Emilie Rutter   Telephone Encounter Signed   Creation Time: 12/05/2022  9:14 AM   Signed     Pt calling to get the pre op team to send over confirmation for his pre op clearance for a procedure. The phone note is showing it was sent over 7/23 and the pt is stating that the office did not receive an answer from [place] regarding the clearance. Please advise.

## 2022-12-05 NOTE — Telephone Encounter (Signed)
I s/w the pt and he said the requesting office stated to him that they still have not received our notes providing clearance. I did confirm the fax # with the pt that the surgeon's office gave him.    I did also send to [facility] CHART for the pt as well. Pt thanked me for the help.    I assured the pt that I will re-fax again to surgeon's office again today.           Emilie Rutter   Telephone Encounter Signed   Creation Time: 12/05/2022  9:14 AM   Signed     Pt calling to get the pre op team to send over confirmation for his pre op clearance for a procedure. The phone note is showing it was sent over 7/23 and the pt is stating that the office did not receive an answer from [place] regarding the clearance. Please advise.

## 2022-12-05 NOTE — Telephone Encounter (Signed)
I s/w the pt and he said the requesting office stated to him that they still have not received our notes providing clearance. I did confirm the fax # with the pt that the surgeon's office gave him.   I did also send to [facility] CHART for the pt as well. Pt thanked me for the help.   I assured the pt that I will re-fax again to surgeon's office again today.

## 2022-12-30 ENCOUNTER — Ambulatory Visit: Payer: BC Managed Care – PPO | Admitting: Cardiovascular Disease

## 2023-01-12 ENCOUNTER — Ambulatory Visit: Payer: No Typology Code available for payment source | Admitting: Student

## 2023-01-23 DIAGNOSIS — Z1212 Encounter for screening for malignant neoplasm of rectum: Secondary | ICD-10-CM | POA: Diagnosis not present

## 2023-02-13 ENCOUNTER — Ambulatory Visit: Payer: No Typology Code available for payment source | Attending: Student | Admitting: Cardiology

## 2023-02-13 ENCOUNTER — Ambulatory Visit: Payer: No Typology Code available for payment source | Admitting: Cardiology

## 2023-02-13 ENCOUNTER — Encounter: Payer: Self-pay | Admitting: Cardiology

## 2023-02-13 VITALS — BP 138/80 | HR 59 | Ht 70.0 in | Wt 258.0 lb

## 2023-02-13 DIAGNOSIS — I480 Paroxysmal atrial fibrillation: Secondary | ICD-10-CM | POA: Diagnosis not present

## 2023-02-13 DIAGNOSIS — I10 Essential (primary) hypertension: Secondary | ICD-10-CM | POA: Diagnosis not present

## 2023-02-13 NOTE — Progress Notes (Signed)
  Electrophysiology Office Note:   Date:  02/13/2023  ID:  Andrew [name], DOB 10/22/1955, MRN [id number]  Primary Cardiologist: None Electrophysiologist: Kamilia Carollo Jorja Loa, MD      History of Present Illness:   Andrew [name] is a 67 y.o. male with h/o atrial fibrillation, hypertension seen today for routine electrophysiology followup.   Since last being seen in our clinic the patient reports doing well.  He is now on Eliquis for his atrial fibrillation.  He is unaware of arrhythmias.  He is able to do all of his daily activities, only restricted by knee pain.  He has no chest pain or shortness of breath.  He has no fatigue.  He had an elevated atrial fibrillation burden, but is unaware.  he denies chest pain, palpitations, dyspnea, PND, orthopnea, nausea, vomiting, dizziness, syncope, edema, weight gain, or early satiety.   Review of systems complete and found to be negative unless listed in HPI.   EP Information / Studies Reviewed:    EKG is ordered today. Personal review as below.  EKG Interpretation Date/Time:  Monday February 13 2023 14:11:35 EDT Ventricular Rate:  59 PR Interval:  312 QRS Duration:  134 QT Interval:  452 QTC Calculation: 447 R Axis:   59  Text Interpretation: Sinus bradycardia with 1st degree A-V block Right bundle branch block When compared with ECG of 31-Oct-2022 08:27, No significant change was found Confirmed by Rogina Schiano ([id number]) on 02/13/2023 2:25:14 PM     Risk Assessment/Calculations:    CHA2DS2-VASc Score = 3   This indicates a 3.2% annual risk of stroke. The patient's score is based upon: CHF History: 0 HTN History: 1 Diabetes History: 1 Stroke History: 0 Vascular Disease History: 0 Age Score: 1 Gender Score: 0             Physical Exam:   VS:  BP 138/80 (BP Location: Right Arm, Patient Position: Sitting, Cuff Size: Normal)   Pulse (!) 59   Ht 5\' 10"  (1.778 m)   Wt 258 lb (117 kg)   BMI 37.02 kg/m    Wt Readings from Last 3  Encounters:  02/13/23 258 lb (117 kg)  10/31/22 258 lb (117 kg)  09/01/16 284 lb (128.8 kg)     GEN: Well nourished, well developed in no acute distress NECK: No JVD; No carotid bruits CARDIAC: Regular rate and rhythm, no murmurs, rubs, gallops RESPIRATORY:  Clear to auscultation without rales, wheezing or rhonchi  ABDOMEN: Soft, non-tender, non-distended EXTREMITIES:  No edema; No deformity   ASSESSMENT AND PLAN:    1.  Paroxysmal atrial fibrillation: Currently on metoprolol.  Asymptomatic from his atrial fibrillation.  [name] continue with current management.  If he becomes symptomatic, rhythm control be reasonable.  2.  Secondary hypercoagulable state: Currently on Eliquis for atrial fibrillation  3.  Hypertension: Well-controlled  4.  Coronary artery calcifications: Elevated LDL.  Continue Crestor.  Follow up with EP APP in 6 months  Signed, Beula Joyner Jorja Loa, MD

## 2023-03-08 ENCOUNTER — Other Ambulatory Visit: Payer: Self-pay

## 2023-03-08 DIAGNOSIS — I480 Paroxysmal atrial fibrillation: Secondary | ICD-10-CM

## 2023-03-08 MED ORDER — APIXABAN 5 MG PO TABS: 5.0000 mg | ORAL_TABLET | Freq: Two times a day (BID) | ORAL | 1 refills | Status: DC

## 2023-03-08 NOTE — Telephone Encounter (Signed)
Prescription refill request for Eliquis received. Indication: Afib  Last office visit: 02/13/23 ([place])  Scr: 0.86 (10/31/22)  Age: 67 Weight: 117kg  Appropriate dose. Refill sent.

## 2023-03-09 MED ORDER — ROSUVASTATIN CALCIUM 40 MG PO TABS: 40.0000 mg | ORAL_TABLET | Freq: Every day | ORAL | 1 refills | Status: AC

## 2023-03-10 ENCOUNTER — Telehealth: Payer: Self-pay

## 2023-03-10 NOTE — Telephone Encounter (Signed)
   Pre-operative Risk Assessment    Patient Name: Andrew [name]  DOB: 05-Dec-1955 MRN: [id number]  Last office visit : 02/13/2023 Upcoming appointment : Unknown     Request for Surgical Clearance    Procedure:   Colonoscopy  Date of Surgery:  Clearance 04/24/23                                 Surgeon:  Dr. Ewing Schlein Surgeon's Group or Practice Name:  [facility] Phone number:  [phone] Fax number:  [phone]   Type of Clearance Requested:   - Medical  - Pharmacy:  Hold Apixaban (Eliquis) : Not indicated   Type of Anesthesia:   Propofol   Additional requests/questions:    Elyse Jarvis   03/10/2023, 12:12 PM

## 2023-03-13 NOTE — Telephone Encounter (Signed)
Please advise holding Eliquis prior to colonoscopy.  Thank you!  DW  

## 2023-03-13 NOTE — Telephone Encounter (Signed)
Dr. Elberta Fortis,  You saw this patient on 02/13/2019 for. Per office protocol, will you please comment on medical clearance for colonoscopy on 04/24/2023?  Please route your response to P CV DIV Preop. I will communicate with requesting office once you have given recommendations.   Thank you!  Carlos Levering, NP

## 2023-03-14 NOTE — Telephone Encounter (Signed)
Patient with diagnosis of afib on Eliquis for anticoagulation.    Procedure: colonoscopy Date of procedure: 04/24/23   CHA2DS2-VASc Score = 3   This indicates a 3.2% annual risk of stroke. The patient's score is based upon: CHF History: 0 HTN History: 1 Diabetes History: 1 Stroke History: 0 Vascular Disease History: 0 Age Score: 1 Gender Score: 0      CrCl 105 ml/min Platelet count 345  Per office protocol, patient can hold Elqiuis for 2 days prior to procedure.    **This guidance is not considered finalized until pre-operative APP has relayed final recommendations.**

## 2023-03-14 NOTE — Telephone Encounter (Signed)
   Name: Andrew [name]  DOB: 05/26/1955  MRN: [id number]   Primary Cardiologist: None  Chart reviewed as part of pre-operative protocol coverage. Andrew [name] was last seen on 02/13/2023 by Dr. Elberta Fortis.  Per Dr. Elberta Fortis "Low to intermediate risk for intermediate risk procedure, no further cardiac testing necessary."  Therefore, based on ACC/AHA guidelines, the patient would be an acceptable risk for the planned procedure without further cardiovascular testing.   Per Pharm D, patient may hold Eliquis for 2 days prior to procedure.    I will route this recommendation to the requesting party via Epic fax function and remove from pre-op pool. Please call with questions.  Carlos Levering, NP 03/14/2023, 9:16 AM

## 2023-09-26 DIAGNOSIS — K080 Exfoliation of teeth due to systemic causes: Secondary | ICD-10-CM | POA: Diagnosis not present

## 2023-10-25 DIAGNOSIS — I10 Essential (primary) hypertension: Secondary | ICD-10-CM | POA: Diagnosis not present

## 2023-10-25 DIAGNOSIS — E1165 Type 2 diabetes mellitus with hyperglycemia: Secondary | ICD-10-CM | POA: Diagnosis not present

## 2023-12-06 ENCOUNTER — Other Ambulatory Visit: Payer: Self-pay

## 2023-12-06 ENCOUNTER — Other Ambulatory Visit: Payer: Self-pay | Admitting: Cardiology

## 2023-12-06 DIAGNOSIS — I480 Paroxysmal atrial fibrillation: Secondary | ICD-10-CM

## 2023-12-06 MED ORDER — APIXABAN 5 MG PO TABS: 5.0000 mg | ORAL_TABLET | Freq: Two times a day (BID) | ORAL | 1 refills | Status: DC

## 2023-12-06 NOTE — Telephone Encounter (Signed)
 Prescription refill request for Eliquis  received. Indication:afib Last office visit:10/24 Scr:0.86  2024 Age: 68 Weight:117  kg  Prescription refilled

## 2023-12-06 NOTE — Telephone Encounter (Signed)
*  STAT* If patient is at the pharmacy, call can be transferred to refill team.   1. Which medications need to be refilled? (please list name of each medication and dose if known) new prescription for Eliquis    2. Would you like to learn more about the convenience, safety, & potential cost savings by using the [facility]?     3. Are you open to using the [facility] (Type [facility].   4. Which pharmacy/location (including street and city if local pharmacy) is medication to be sent to?[facility], [place]   5. Do they need a 30 day or 90 day supply? 90 days # 180 and refills

## 2023-12-07 MED ORDER — APIXABAN 5 MG PO TABS: 5.0000 mg | ORAL_TABLET | Freq: Two times a day (BID) | ORAL | 1 refills | Status: DC

## 2023-12-07 NOTE — Telephone Encounter (Signed)
 Received faxed lab results from PCP Creat 0.6 on 07/25/23.  Will refill rx.

## 2023-12-07 NOTE — Telephone Encounter (Signed)
 Pt last saw Dr Inocencio 02/13/23, last labs 07/25/23 per [facility].  Called PCP office, [facility] to fax those results to us .  Will await faxed results to refill rx. Age 68, weight 117kg, based on specified criteria pt is on appropriate dosage of Eliquis  5mg  BID for afib.

## 2023-12-12 ENCOUNTER — Other Ambulatory Visit: Payer: Self-pay | Admitting: *Deleted

## 2023-12-12 DIAGNOSIS — I480 Paroxysmal atrial fibrillation: Secondary | ICD-10-CM

## 2023-12-12 MED ORDER — APIXABAN 5 MG PO TABS: 5.0000 mg | ORAL_TABLET | Freq: Two times a day (BID) | ORAL | 1 refills | Status: AC

## 2023-12-12 NOTE — Telephone Encounter (Signed)
 Eliquis  5mg  refill request received from [employer]. Patient is 68 years old, weight-117kg, Crea-0.6 on 11/09/23 via scanned labs from PCP, Diagnosis-Afib, and last seen by Dr. Inocencio on 02/13/23. Dose is appropriate based on dosing criteria. Will send in refill to requested pharmacy.

## 2024-01-29 DIAGNOSIS — E7849 Other hyperlipidemia: Secondary | ICD-10-CM | POA: Diagnosis not present

## 2024-01-29 DIAGNOSIS — E1165 Type 2 diabetes mellitus with hyperglycemia: Secondary | ICD-10-CM | POA: Diagnosis not present

## 2024-01-29 DIAGNOSIS — I10 Essential (primary) hypertension: Secondary | ICD-10-CM | POA: Diagnosis not present

## 2024-01-29 DIAGNOSIS — Z1212 Encounter for screening for malignant neoplasm of rectum: Secondary | ICD-10-CM | POA: Diagnosis not present

## 2024-01-31 DIAGNOSIS — H2513 Age-related nuclear cataract, bilateral: Secondary | ICD-10-CM | POA: Diagnosis not present

## 2024-01-31 DIAGNOSIS — H401414 Capsular glaucoma with pseudoexfoliation of lens, right eye, indeterminate stage: Secondary | ICD-10-CM | POA: Diagnosis not present

## 2024-01-31 DIAGNOSIS — E119 Type 2 diabetes mellitus without complications: Secondary | ICD-10-CM | POA: Diagnosis not present

## 2024-01-31 DIAGNOSIS — H43813 Vitreous degeneration, bilateral: Secondary | ICD-10-CM | POA: Diagnosis not present

## 2024-02-05 DIAGNOSIS — E1165 Type 2 diabetes mellitus with hyperglycemia: Secondary | ICD-10-CM | POA: Diagnosis not present

## 2024-02-05 DIAGNOSIS — Z0000 Encounter for general adult medical examination without abnormal findings: Secondary | ICD-10-CM | POA: Diagnosis not present

## 2024-02-05 DIAGNOSIS — I10 Essential (primary) hypertension: Secondary | ICD-10-CM | POA: Diagnosis not present

## 2024-03-12 DIAGNOSIS — K080 Exfoliation of teeth due to systemic causes: Secondary | ICD-10-CM | POA: Diagnosis not present

## 2024-03-29 DIAGNOSIS — K080 Exfoliation of teeth due to systemic causes: Secondary | ICD-10-CM | POA: Diagnosis not present

## 2024-04-17 DIAGNOSIS — I10 Essential (primary) hypertension: Secondary | ICD-10-CM | POA: Diagnosis not present

## 2024-04-17 DIAGNOSIS — E1165 Type 2 diabetes mellitus with hyperglycemia: Secondary | ICD-10-CM | POA: Diagnosis not present
